# Patient Record
Sex: Male | Born: 1945 | Race: White | Hispanic: No | Marital: Married | Smoking: Current every day smoker
Health system: Southern US, Community
[De-identification: ages and names within clinical notes are randomized; demographics above are authoritative.]

## PROBLEM LIST (undated history)

## (undated) ENCOUNTER — Emergency Department (HOSPITAL_COMMUNITY): Admission: EM | Payer: Medicare Other | Source: Home / Self Care

## (undated) DIAGNOSIS — E785 Hyperlipidemia, unspecified: Secondary | ICD-10-CM

## (undated) DIAGNOSIS — J189 Pneumonia, unspecified organism: Secondary | ICD-10-CM

## (undated) DIAGNOSIS — F4310 Post-traumatic stress disorder, unspecified: Secondary | ICD-10-CM

## (undated) DIAGNOSIS — G894 Chronic pain syndrome: Secondary | ICD-10-CM

## (undated) DIAGNOSIS — F32A Depression, unspecified: Secondary | ICD-10-CM

## (undated) DIAGNOSIS — G4730 Sleep apnea, unspecified: Secondary | ICD-10-CM

## (undated) DIAGNOSIS — J449 Chronic obstructive pulmonary disease, unspecified: Secondary | ICD-10-CM

## (undated) DIAGNOSIS — M1990 Unspecified osteoarthritis, unspecified site: Secondary | ICD-10-CM

## (undated) DIAGNOSIS — F419 Anxiety disorder, unspecified: Secondary | ICD-10-CM

## (undated) DIAGNOSIS — R519 Headache, unspecified: Secondary | ICD-10-CM

## (undated) DIAGNOSIS — I639 Cerebral infarction, unspecified: Secondary | ICD-10-CM

## (undated) DIAGNOSIS — F329 Major depressive disorder, single episode, unspecified: Secondary | ICD-10-CM

## (undated) DIAGNOSIS — G8929 Other chronic pain: Secondary | ICD-10-CM

## (undated) DIAGNOSIS — I82409 Acute embolism and thrombosis of unspecified deep veins of unspecified lower extremity: Secondary | ICD-10-CM

## (undated) DIAGNOSIS — G43909 Migraine, unspecified, not intractable, without status migrainosus: Secondary | ICD-10-CM

## (undated) DIAGNOSIS — M549 Dorsalgia, unspecified: Secondary | ICD-10-CM

## (undated) DIAGNOSIS — I219 Acute myocardial infarction, unspecified: Secondary | ICD-10-CM

## (undated) DIAGNOSIS — I2510 Atherosclerotic heart disease of native coronary artery without angina pectoris: Secondary | ICD-10-CM

## (undated) DIAGNOSIS — G629 Polyneuropathy, unspecified: Secondary | ICD-10-CM

## (undated) DIAGNOSIS — F1010 Alcohol abuse, uncomplicated: Secondary | ICD-10-CM

## (undated) DIAGNOSIS — R51 Headache: Secondary | ICD-10-CM

## (undated) HISTORY — DX: Depression, unspecified: F32.A

## (undated) HISTORY — DX: Acute embolism and thrombosis of unspecified deep veins of unspecified lower extremity: I82.409

## (undated) HISTORY — PX: TONSILLECTOMY: SUR1361

## (undated) HISTORY — DX: Hyperlipidemia, unspecified: E78.5

## (undated) HISTORY — DX: Alcohol abuse, uncomplicated: F10.10

## (undated) HISTORY — PX: ANTERIOR CERVICAL DECOMP/DISCECTOMY FUSION: SHX1161

## (undated) HISTORY — PX: CORONARY ANGIOPLASTY: SHX604

## (undated) HISTORY — DX: Polyneuropathy, unspecified: G62.9

## (undated) HISTORY — DX: Atherosclerotic heart disease of native coronary artery without angina pectoris: I25.10

## (undated) HISTORY — DX: Chronic pain syndrome: G89.4

## (undated) HISTORY — PX: APPENDECTOMY: SHX54

## (undated) HISTORY — PX: CARDIAC CATHETERIZATION: SHX172

## (undated) HISTORY — DX: Post-traumatic stress disorder, unspecified: F43.10

## (undated) HISTORY — PX: VASECTOMY: SHX75

## (undated) HISTORY — DX: Major depressive disorder, single episode, unspecified: F32.9

---

## 1997-04-13 DIAGNOSIS — I219 Acute myocardial infarction, unspecified: Secondary | ICD-10-CM

## 1997-04-13 HISTORY — DX: Acute myocardial infarction, unspecified: I21.9

## 1997-12-24 ENCOUNTER — Encounter: Admission: RE | Admit: 1997-12-24 | Discharge: 1998-03-24 | Payer: Self-pay | Admitting: *Deleted

## 1998-06-28 ENCOUNTER — Encounter: Payer: Self-pay | Admitting: Family Medicine

## 1998-06-28 ENCOUNTER — Ambulatory Visit (HOSPITAL_COMMUNITY): Admission: RE | Admit: 1998-06-28 | Discharge: 1998-06-28 | Payer: Self-pay | Admitting: *Deleted

## 1998-08-23 ENCOUNTER — Ambulatory Visit (HOSPITAL_COMMUNITY): Admission: RE | Admit: 1998-08-23 | Discharge: 1998-08-23 | Payer: Self-pay | Admitting: Neurosurgery

## 1998-08-23 ENCOUNTER — Encounter: Payer: Self-pay | Admitting: Neurosurgery

## 1998-08-25 ENCOUNTER — Inpatient Hospital Stay (HOSPITAL_COMMUNITY): Admission: EM | Admit: 1998-08-25 | Discharge: 1998-08-27 | Payer: Self-pay | Admitting: Neurosurgery

## 1998-10-25 ENCOUNTER — Encounter: Payer: Self-pay | Admitting: Neurosurgery

## 1998-10-29 ENCOUNTER — Ambulatory Visit (HOSPITAL_COMMUNITY): Admission: RE | Admit: 1998-10-29 | Discharge: 1998-10-29 | Payer: Self-pay | Admitting: Neurosurgery

## 1998-11-05 ENCOUNTER — Encounter: Payer: Self-pay | Admitting: Neurosurgery

## 1998-11-07 ENCOUNTER — Encounter: Payer: Self-pay | Admitting: Neurosurgery

## 1998-11-07 ENCOUNTER — Inpatient Hospital Stay (HOSPITAL_COMMUNITY): Admission: RE | Admit: 1998-11-07 | Discharge: 1998-11-09 | Payer: Self-pay | Admitting: Neurosurgery

## 1998-11-08 ENCOUNTER — Encounter: Payer: Self-pay | Admitting: Neurosurgery

## 1999-03-19 ENCOUNTER — Encounter: Payer: Self-pay | Admitting: Neurosurgery

## 1999-03-19 ENCOUNTER — Encounter: Admission: RE | Admit: 1999-03-19 | Discharge: 1999-03-19 | Payer: Self-pay | Admitting: Neurosurgery

## 1999-10-11 ENCOUNTER — Ambulatory Visit (HOSPITAL_COMMUNITY): Admission: RE | Admit: 1999-10-11 | Discharge: 1999-10-11 | Payer: Self-pay | Admitting: Internal Medicine

## 2000-03-09 ENCOUNTER — Emergency Department (HOSPITAL_COMMUNITY): Admission: EM | Admit: 2000-03-09 | Discharge: 2000-03-09 | Payer: Self-pay | Admitting: Emergency Medicine

## 2000-10-12 ENCOUNTER — Encounter: Payer: Self-pay | Admitting: Specialist

## 2000-10-12 ENCOUNTER — Ambulatory Visit (HOSPITAL_COMMUNITY): Admission: RE | Admit: 2000-10-12 | Discharge: 2000-10-12 | Payer: Self-pay | Admitting: Specialist

## 2002-11-22 ENCOUNTER — Encounter: Payer: Self-pay | Admitting: Neurosurgery

## 2002-11-22 ENCOUNTER — Ambulatory Visit (HOSPITAL_COMMUNITY): Admission: RE | Admit: 2002-11-22 | Discharge: 2002-11-22 | Payer: Self-pay | Admitting: Neurosurgery

## 2002-12-13 HISTORY — PX: POSTERIOR LUMBAR FUSION: SHX6036

## 2002-12-27 ENCOUNTER — Encounter: Payer: Self-pay | Admitting: Neurosurgery

## 2002-12-29 ENCOUNTER — Inpatient Hospital Stay (HOSPITAL_COMMUNITY): Admission: RE | Admit: 2002-12-29 | Discharge: 2003-01-02 | Payer: Self-pay | Admitting: Neurosurgery

## 2002-12-29 ENCOUNTER — Encounter: Payer: Self-pay | Admitting: Neurosurgery

## 2003-04-23 ENCOUNTER — Encounter: Admission: RE | Admit: 2003-04-23 | Discharge: 2003-07-22 | Payer: Self-pay | Admitting: Neurosurgery

## 2003-07-11 ENCOUNTER — Ambulatory Visit (HOSPITAL_COMMUNITY): Admission: RE | Admit: 2003-07-11 | Discharge: 2003-07-11 | Payer: Self-pay | Admitting: Neurosurgery

## 2003-07-13 HISTORY — PX: POSTERIOR LUMBAR FUSION: SHX6036

## 2003-08-03 ENCOUNTER — Inpatient Hospital Stay (HOSPITAL_COMMUNITY): Admission: RE | Admit: 2003-08-03 | Discharge: 2003-08-07 | Payer: Self-pay | Admitting: Neurosurgery

## 2004-03-13 ENCOUNTER — Ambulatory Visit (HOSPITAL_COMMUNITY): Admission: RE | Admit: 2004-03-13 | Discharge: 2004-03-13 | Payer: Self-pay | Admitting: Neurosurgery

## 2004-09-12 ENCOUNTER — Ambulatory Visit: Payer: Self-pay | Admitting: *Deleted

## 2004-09-19 ENCOUNTER — Ambulatory Visit: Payer: Self-pay | Admitting: *Deleted

## 2004-09-26 ENCOUNTER — Ambulatory Visit: Payer: Self-pay | Admitting: *Deleted

## 2004-10-10 ENCOUNTER — Ambulatory Visit: Payer: Self-pay | Admitting: *Deleted

## 2004-10-17 ENCOUNTER — Ambulatory Visit: Payer: Self-pay | Admitting: *Deleted

## 2004-11-14 ENCOUNTER — Ambulatory Visit: Payer: Self-pay | Admitting: *Deleted

## 2004-11-19 ENCOUNTER — Ambulatory Visit: Payer: Self-pay | Admitting: Internal Medicine

## 2004-11-21 ENCOUNTER — Ambulatory Visit: Payer: Self-pay | Admitting: Cardiology

## 2004-11-28 ENCOUNTER — Ambulatory Visit: Payer: Self-pay | Admitting: *Deleted

## 2004-12-05 ENCOUNTER — Ambulatory Visit: Payer: Self-pay | Admitting: *Deleted

## 2004-12-19 ENCOUNTER — Ambulatory Visit: Payer: Self-pay | Admitting: Internal Medicine

## 2004-12-26 ENCOUNTER — Ambulatory Visit: Payer: Self-pay | Admitting: *Deleted

## 2005-01-02 ENCOUNTER — Ambulatory Visit: Payer: Self-pay | Admitting: *Deleted

## 2005-01-16 ENCOUNTER — Ambulatory Visit: Payer: Self-pay | Admitting: *Deleted

## 2005-02-15 ENCOUNTER — Encounter: Admission: RE | Admit: 2005-02-15 | Discharge: 2005-02-15 | Payer: Self-pay | Admitting: Anesthesiology

## 2005-08-06 ENCOUNTER — Ambulatory Visit (HOSPITAL_COMMUNITY): Admission: RE | Admit: 2005-08-06 | Discharge: 2005-08-06 | Payer: Self-pay | Admitting: Neurosurgery

## 2006-02-19 ENCOUNTER — Emergency Department (HOSPITAL_COMMUNITY): Admission: EM | Admit: 2006-02-19 | Discharge: 2006-02-19 | Payer: Self-pay | Admitting: Emergency Medicine

## 2006-05-28 ENCOUNTER — Encounter: Admission: RE | Admit: 2006-05-28 | Discharge: 2006-05-28 | Payer: Self-pay | Admitting: Neurosurgery

## 2006-12-24 ENCOUNTER — Ambulatory Visit (HOSPITAL_COMMUNITY): Admission: RE | Admit: 2006-12-24 | Discharge: 2006-12-24 | Payer: Self-pay | Admitting: Neurosurgery

## 2007-01-12 ENCOUNTER — Ambulatory Visit (HOSPITAL_COMMUNITY): Admission: RE | Admit: 2007-01-12 | Discharge: 2007-01-12 | Payer: Self-pay | Admitting: Neurosurgery

## 2007-06-30 ENCOUNTER — Ambulatory Visit (HOSPITAL_COMMUNITY): Admission: RE | Admit: 2007-06-30 | Discharge: 2007-06-30 | Payer: Self-pay | Admitting: Anesthesiology

## 2007-07-12 ENCOUNTER — Ambulatory Visit: Payer: Self-pay | Admitting: Cardiology

## 2007-07-25 ENCOUNTER — Ambulatory Visit: Payer: Self-pay

## 2007-07-25 ENCOUNTER — Encounter: Payer: Self-pay | Admitting: Cardiology

## 2007-09-16 ENCOUNTER — Ambulatory Visit: Payer: Self-pay | Admitting: Vascular Surgery

## 2008-10-10 ENCOUNTER — Encounter: Admission: RE | Admit: 2008-10-10 | Discharge: 2008-10-23 | Payer: Self-pay | Admitting: Family Medicine

## 2008-11-11 HISTORY — PX: CYST EXCISION: SHX5701

## 2008-11-23 ENCOUNTER — Ambulatory Visit (HOSPITAL_BASED_OUTPATIENT_CLINIC_OR_DEPARTMENT_OTHER): Admission: RE | Admit: 2008-11-23 | Discharge: 2008-11-23 | Payer: Self-pay | Admitting: Otolaryngology

## 2008-11-23 ENCOUNTER — Encounter (INDEPENDENT_AMBULATORY_CARE_PROVIDER_SITE_OTHER): Payer: Self-pay | Admitting: Otolaryngology

## 2008-12-25 ENCOUNTER — Encounter: Admission: RE | Admit: 2008-12-25 | Discharge: 2008-12-25 | Payer: Self-pay | Admitting: Neurosurgery

## 2009-01-11 HISTORY — PX: POSTERIOR LUMBAR FUSION: SHX6036

## 2009-01-22 ENCOUNTER — Inpatient Hospital Stay (HOSPITAL_COMMUNITY): Admission: RE | Admit: 2009-01-22 | Discharge: 2009-01-25 | Payer: Self-pay | Admitting: Neurosurgery

## 2010-01-06 ENCOUNTER — Encounter: Admission: RE | Admit: 2010-01-06 | Discharge: 2010-01-06 | Payer: Self-pay | Admitting: Neurosurgery

## 2010-01-11 HISTORY — PX: FIXATION KYPHOPLASTY THORACIC SPINE: SHX1643

## 2010-01-16 ENCOUNTER — Encounter: Payer: Self-pay | Admitting: Gastroenterology

## 2010-01-23 ENCOUNTER — Ambulatory Visit (HOSPITAL_COMMUNITY)
Admission: RE | Admit: 2010-01-23 | Discharge: 2010-01-23 | Payer: Self-pay | Source: Home / Self Care | Admitting: Neurosurgery

## 2010-02-04 ENCOUNTER — Ambulatory Visit: Payer: Self-pay | Admitting: Gastroenterology

## 2010-03-24 ENCOUNTER — Encounter (INDEPENDENT_AMBULATORY_CARE_PROVIDER_SITE_OTHER): Payer: Self-pay | Admitting: *Deleted

## 2010-04-17 ENCOUNTER — Ambulatory Visit
Admission: RE | Admit: 2010-04-17 | Discharge: 2010-04-17 | Payer: Self-pay | Source: Home / Self Care | Attending: Gastroenterology | Admitting: Gastroenterology

## 2010-04-17 ENCOUNTER — Telehealth: Payer: Self-pay | Admitting: Gastroenterology

## 2010-04-28 ENCOUNTER — Telehealth: Payer: Self-pay | Admitting: Gastroenterology

## 2010-04-28 ENCOUNTER — Ambulatory Visit: Admit: 2010-04-28 | Payer: Self-pay | Admitting: Gastroenterology

## 2010-05-01 ENCOUNTER — Ambulatory Visit: Admit: 2010-05-01 | Payer: Self-pay | Admitting: Gastroenterology

## 2010-05-04 ENCOUNTER — Encounter: Payer: Self-pay | Admitting: Neurosurgery

## 2010-05-05 ENCOUNTER — Encounter: Payer: Self-pay | Admitting: Neurosurgery
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
## 2010-05-15 NOTE — Progress Notes (Signed)
Summary: Changed pt to office visit  ---- Converted from flag ---- ---- 04/17/2010 11:07 AM, Selinda Michaels RN wrote: Patient saw pre-visit nurse today and she spoke with Arlyce Dice and he is having problems and needs to be seen and not  a direct. He is seeing him on the 16th. She also thought patient may need propofol. Patient is scheduled in the [facility] for a regular on the 19th. If he changes him to propofol will need to cancel the regular colon scheduled for the 19th.  Thanks, Bonita Quin ------------------------------

## 2010-05-15 NOTE — Progress Notes (Signed)
Summary: Pt cancelled appointment   ---- Converted from flag ---- ---- 04/28/2010 10:59 AM, Leanor Kail [facility] wrote: Cancelled appt today at 11am. Says he is too sick to drive and feels awful. Also cx procedure on 1-19. Wil c-b to reschedule. ------------------------------OK - RK

## 2010-05-15 NOTE — Letter (Signed)
Summary: Pre Visit Letter Revised  [facility]  [address] [place], [place] [address]   Phone: [phone]  Fax: [phone]        03/24/2010 MRN: [id number] Danny [name] [address] [place], [place]  [address]             Procedure Date:  05/01/2010  Welcome to the Gastroenterology Division at [facility].    You are scheduled to see a nurse for your pre-procedure visit on 04/17/2010 at 10:30AM on the 3rd floor at [facility], [address].  We ask that you try to arrive at our office 15 minutes prior to your appointment time to allow for check-in.  Please take a minute to review the attached form.  If you answer "Yes" to one or more of the questions on the first page, we ask that you call the person listed at your earliest opportunity.  If you answer "No" to all of the questions, please complete the rest of the form and bring it to your appointment.    Your nurse visit will consist of discussing your medical and surgical history, your immediate family medical history, and your medications.   If you are unable to list all of your medications on the form, please bring the medication bottles to your appointment and we will list them.  We will need to be aware of both prescribed and over the counter drugs.  We will need to know exact dosage information as well.    Please be prepared to read and sign documents such as consent forms, a financial agreement, and acknowledgement forms.  If necessary, and with your consent, a friend or relative is welcome to sit-in on the nurse visit with you.  Please bring your insurance card so that we may make a copy of it.  If your insurance requires a referral to see a specialist, please bring your referral form from your primary care physician.  No co-pay is required for this nurse visit.     If you cannot keep your appointment, please call [phone] to cancel or reschedule prior to your appointment date.  This  allows [place] the opportunity to schedule an appointment for another patient in need of care.    Thank you for choosing [facility] for your medical needs.  We appreciate the opportunity to care for you.  Please visit [place] at our website  to learn more about our practice.  Sincerely, The Gastroenterology Division

## 2010-05-15 NOTE — Letter (Signed)
Summary: Pre Visit Letter Revised  [facility]  [address] [place], [place] [address]   Phone: [phone]  Fax: [phone]        01/16/2010 MRN: [id number]  [facility] [address] [place], [place]  [address]             Procedure Date:  11-8 11am   Welcome to the Gastroenterology Division at [facility]    You are scheduled to see a nurse for your pre-procedure visit on 02-04-10 at 10am on the 3rd floor at [facility], [address].  We ask that you try to arrive at our office 15 minutes prior to your appointment time to allow for check-in.  Please take a minute to review the attached form.  If you answer "Yes" to one or more of the questions on the first page, we ask that you call the person listed at your earliest opportunity.  If you answer "No" to all of the questions, please complete the rest of the form and bring it to your appointment.    Your nurse visit will consist of discussing your medical and surgical history, your immediate family medical history, and your medications.   If you are unable to list all of your medications on the form, please bring the medication bottles to your appointment and we will list them.  We will need to be aware of both prescribed and over the counter drugs.  We will need to know exact dosage information as well.    Please be prepared to read and sign documents such as consent forms, a financial agreement, and acknowledgement forms.  If necessary, and with your consent, a friend or relative is welcome to sit-in on the nurse visit with you.  Please bring your insurance card so that we may make a copy of it.  If your insurance requires a referral to see a specialist, please bring your referral form from your primary care physician.  No co-pay is required for this nurse visit.     If you cannot keep your appointment, please call [phone] to cancel or reschedule prior to your appointment date.  This  allows [place] the opportunity to schedule an appointment for another patient in need of care.    Thank you for choosing [facility] for your medical needs.  We appreciate the opportunity to care for you.  Please visit [place] at our website  to learn more about our practice.  Sincerely, The Gastroenterology Division

## 2010-05-15 NOTE — Miscellaneous (Signed)
Summary: [facility] previsit  Clinical Lists Changes 

## 2010-06-06 ENCOUNTER — Encounter (INDEPENDENT_AMBULATORY_CARE_PROVIDER_SITE_OTHER): Payer: Self-pay | Admitting: *Deleted

## 2010-06-10 NOTE — Letter (Signed)
Summary: New Patient letter  [facility]  [address] [place], [place] [address]   Phone: [phone]  Fax: [phone]       06/06/2010 MRN: [id number]  MD SMOLA [address] [place], [place]  [address]  Dear Mr. Hernon,  Welcome to the Gastroenterology Division at [facility].    You are scheduled to see Dr.  Arlyce Dice on 07-21-10 at 2:00P.M. on the 3rd floor at [facility], [address].  We ask that you try to arrive at our office 15 minutes prior to your appointment time to allow for check-in.  We would like you to complete the enclosed self-administered evaluation form prior to your visit and bring it with you on the day of your appointment.  We will review it with you.  Also, please bring a complete list of all your medications or, if you prefer, bring the medication bottles and we will list them.  Please bring your insurance card so that we may make a copy of it.  If your insurance requires a referral to see a specialist, please bring your referral form from your primary care physician.  Co-payments are due at the time of your visit and may be paid by cash, check or credit card.     Your office visit will consist of a consult with your physician (includes a physical exam), any laboratory testing he/she may order, scheduling of any necessary diagnostic testing (e.g. x-ray, ultrasound, CT-scan), and scheduling of a procedure (e.g. Endoscopy, Colonoscopy) if required.  Please allow enough time on your schedule to allow for any/all of these possibilities.    If you cannot keep your appointment, please call [phone] to cancel or reschedule prior to your appointment date.  This allows [place] the opportunity to schedule an appointment for another patient in need of care.  If you do not cancel or reschedule by 5 p.m. the business day prior to your appointment date, you will be charged a $50.00 late cancellation/no-show fee.    Thank you for choosing  [facility] for your medical needs.  We appreciate the opportunity to care for you.  Please visit [place] at our website  to learn more about our practice.                     Sincerely,                                                             The Gastroenterology Division

## 2010-06-23 ENCOUNTER — Ambulatory Visit: Payer: Medicare Other | Attending: Neurology

## 2010-06-23 DIAGNOSIS — G4733 Obstructive sleep apnea (adult) (pediatric): Secondary | ICD-10-CM | POA: Insufficient documentation

## 2010-06-23 DIAGNOSIS — R5381 Other malaise: Secondary | ICD-10-CM | POA: Insufficient documentation

## 2010-06-23 DIAGNOSIS — R0609 Other forms of dyspnea: Secondary | ICD-10-CM | POA: Insufficient documentation

## 2010-06-23 DIAGNOSIS — R0989 Other specified symptoms and signs involving the circulatory and respiratory systems: Secondary | ICD-10-CM | POA: Insufficient documentation

## 2010-06-25 LAB — BASIC METABOLIC PANEL
CO2: 31 mEq/L (ref 19–32)
Chloride: 105 mEq/L (ref 96–112)
GFR calc Af Amer: >60 mL/min (ref >60)
Sodium: 138 mEq/L (ref 135–145)

## 2010-06-25 LAB — CBC
Hemoglobin: 14.1 g/dL (ref 13.0–17.0)
MCH: 30.3 pg (ref 26.0–34.0)
MCV: 91.8 fL (ref 78.0–100.0)
RBC: 4.65 MIL/uL (ref 4.22–5.81)

## 2010-06-25 LAB — SURGICAL PCR SCREEN: Staphylococcus aureus: NEGATIVE

## 2010-07-17 LAB — CBC
HCT: 41.2 % (ref 39.0–52.0)
Hemoglobin: 14 g/dL (ref 13.0–17.0)
RBC: 4.6 MIL/uL (ref 4.22–5.81)
RDW: 14 % (ref 11.5–15.5)

## 2010-07-17 LAB — ABO/RH: ABO/RH(D): O POS

## 2010-07-17 LAB — BASIC METABOLIC PANEL
CO2: 29 mEq/L (ref 19–32)
GFR calc Af Amer: >60 mL/min (ref >60)
GFR calc non Af Amer: >60 mL/min (ref >60)
Glucose, Bld: 98 mg/dL (ref 70–99)
Potassium: 4.5 mEq/L (ref 3.5–5.1)
Sodium: 139 mEq/L (ref 135–145)

## 2010-07-17 LAB — TYPE AND SCREEN

## 2010-07-21 ENCOUNTER — Ambulatory Visit: Payer: Self-pay | Admitting: Gastroenterology

## 2010-08-26 NOTE — Op Note (Signed)
NAMEFERRON, Danny               ACCOUNT NO.:  [id number]   MEDICAL RECORD NO.:  [id number]          PATIENT TYPE:  AMB   LOCATION:  DSC                          FACILITY:  [facility]   PHYSICIAN:  Christopher E. Ezzard Standing, M.D.DATE OF BIRTH:  [birth year]   DATE OF PROCEDURE:  11/23/2008  DATE OF DISCHARGE:                               OPERATIVE REPORT   PREOPERATIVE DIAGNOSIS:  Right posterior neck cyst consistent with  sebaceous cyst.   POSTOPERATIVE DIAGNOSIS:  Right posterior neck cyst consistent with  sebaceous cyst.   OPERATION:  Excision of a right posterior neck cyst with intermediate  closure, 2- to 3-cm size.   SURGEON:  Kristine Garbe. Ezzard Standing, MD   ANESTHESIA:  Local Xylocaine with epinephrine.   COMPLICATIONS:  None.   BRIEF CLINICAL NOTE:  Danny [name] is a 65 year old gentleman who has  had approximately 2.5-cm nodule or cyst in the posterior neck that has  been enlarging over the last several months.  He has had this for a  couple of years but only recently has started to enlarge.  On  examination, he has a round cyst in the posterior neck just below the  hairline that is consistent with a probable sebaceous cyst.  He is taken  to the operating room for excision because of enlarging and discomfort  is causing.   DESCRIPTION OF PROCEDURE:  The area was prepped with Betadine solution  and draped out with sterile towels.  The cyst area was then injected  with 10 mL of Xylocaine with epinephrine for local anesthetic.  The skin  directly overlying the cyst was elliptically excised.  Then, the  dissection was carried down through the subcutaneous tissue where the  cyst was identified.  It was dissected off the deep muscle fascia.  Clinically, this was consistent with a sebaceous cyst.  Defect was  closed with 3-0 chromic sutures subcutaneously and 5-0 nylon to  reapproximate the skin edges.  Cyst was sent to pathology.  Bacitracin  ointment and dressing was applied to  the excision site.   Danny [name] is discharged home on Keflex 500 mg b.i.d. for 5 days, Tylenol  p.r.n. pain.  We will have him follow up in my office in 10 days for  recheck to have the sutures removed.           ______________________________  Kristine Garbe Ezzard Standing, M.D.     CEN/MEDQ  D:  11/23/2008  T:  11/24/2008  Job:  [id number]
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
## 2010-08-29 NOTE — Discharge Summary (Signed)
   [name], Danny [name]                         ACCOUNT NO.:  [id number]   MEDICAL RECORD NO.:  [id number]                   PATIENT TYPE:  INP   LOCATION:  3009                                 FACILITY:  [facility]   PHYSICIAN:  Danae Orleans. Venetia Maxon, M.D.               DATE OF BIRTH:  1945/12/09   DATE OF ADMISSION:  12/29/2002  DATE OF DISCHARGE:  01/02/2003                                 DISCHARGE SUMMARY   REASON FOR ADMISSION:  Lumbar spondylolisthesis, lumbar degenerative disk  disease, lumbar spondylosis, recurrent disk herniation.   HISTORY OF PRESENT ILLNESS AND HOSPITAL COURSE:  Danny [name] is a 65-year-  old man with L5-S1 spondylolisthesis, spondylosis, degenerative disk  disease, and stenosis, and disk herniation.  He was admitted to the hospital  and underwent L5 Gill procedure with bilateral L4 laminectomies with  transverse lumbar interbody fusion L4-5 and L5-S1 levels with pedicle screw  fixation at L4 through S1 bilaterally with posterolateral arthrodesis and  morselzied bone autograft.  The patient tolerated the procedure well, was  gradually mobilized and was doing well on the September 21, at which point,  he was discharged home with instructions to follow up in my office in three  weeks with lumbar spine radiograph.   DISCHARGE INSTRUCTIONS:  1. Take Percocet one to two every four hours as needed for pain.  2. Valium as needed for muscle spasms.  3. Gradually mobilize in his lumbar brace.                                                Danae Orleans. Venetia Maxon, M.D.    JDS/MEDQ  D:  02/16/2003  T:  02/17/2003  Job:  [id number]

## 2010-08-29 NOTE — Discharge Summary (Signed)
NAME:  Danny [name], Danny [name]                         ACCOUNT NO.:  [id number]   MEDICAL RECORD NO.:  [id number]                   PATIENT TYPE:  INP   LOCATION:  3008                                 FACILITY:  [facility]   PHYSICIAN:  Hewitt Shorts, M.D.            DATE OF BIRTH:  Dec 30, 1945   DATE OF ADMISSION:  08/03/2003  DATE OF DISCHARGE:  08/07/2003                                 DISCHARGE SUMMARY   HISTORY OF PRESENT ILLNESS:  The patient is a 65 year old man who Dr. Venetia Maxon  operated on about four months ago.  He had a fall during the postoperative  period and subsequently developed significant back pain.  Subsequent x-rays  showed fractures of screws that had been placed at the S1 level and Dr.  Venetia Maxon felt the patient needed to be returned to the operation for  exploration of his fusion and repair of his pseudoarthrosis with new  hardware.  Further details of his history and physical examination were  included in Dr. Fredrich Birks admission note.   HOSPITAL COURSE:  The patient was admitted, underwent exploration of fusion,  revision of an L4-S1 fusion with posterolateral arthrodesis with new  posterior instrumentation.  Following surgery, he has had a moderate amount  of pain.  It was initially controlled with a PCA.  We were eventually able  to convert him over to oral analgesics.  He had some mild fever initially  which improved with incentive spirometry.  We have been able to gradually  increase his ambulation and activity.  He is now ambulating in the halls  with a rolling walker.  He has two rolling walkers at home.  He is afebrile.  His wound is healing well.  He has been given instructions regarding wound  care and activities following discharge.  He is to return to see Dr. Venetia Maxon  in two to three weeks for follow-up.  He is to let us know if he has any  other difficulties in the meantime.   DISCHARGE DIAGNOSIS:  Pseudoarthrosis with fractured sacral screws.                                  Hewitt Shorts, M.D.    RWN/MEDQ  D:  08/07/2003  T:  08/07/2003  Job:  [id number]
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
## 2010-08-29 NOTE — Op Note (Signed)
[name], [name]                         ACCOUNT NO.:  [id number]   MEDICAL RECORD NO.:  [id number]                   PATIENT TYPE:  INP   LOCATION:  3009                                 FACILITY:  [facility]   PHYSICIAN:  Danny [name], M.D.               DATE OF BIRTH:  08/02/1945   DATE OF PROCEDURE:  12/29/2002  DATE OF DISCHARGE:                                 OPERATIVE REPORT   PREOPERATIVE DIAGNOSIS:  L5-S1 spondylolisthesis with spondylosis, stenosis,  degenerative disk disease, and radiculopathy at L4-5 and L5-S1 levels.   POSTOPERATIVE DIAGNOSIS:  L5-S1 spondylolisthesis with spondylosis,  stenosis, degenerative disk disease, and radiculopathy at L4-5 and L5-S1  levels.   PROCEDURES:  L5 Gill procedure with bilateral L4 laminectomies, with  transverse lumbar interbody fusion, L4-5 level (13 mm Synthes allograft with  morselized bone autograft) and L5-S1 (11 mm Synthes allograft with  morcellized bone autograft) with pedicle screw fixation L4 through S1  bilaterally, with posterolateral arthrodesis utilizing morcellized autograft  with platelet concentrate.   SURGEON:  Danny [name], M.D.   ASSISTANT:  Payton Doughty, M.D.   ANESTHESIA:  General endotracheal anesthesia.   ESTIMATED BLOOD LOSS:  600 mL, with 300 mL of Cell Saver blood returned to  the patient.   COMPLICATIONS:  None.   DISPOSITION:  Recovery.   INDICATIONS:  Danny [name] is a 65 year old man with grade 1  spondylolisthesis of L5 on S1 with retrolisthesis of L4 and L5 with stenosis  of the L4-5 level, with degenerative disk disease, spondylosis, and  radiculopathy.  It was elected to take him to surgery for decompression and  fusion at these affected levels.   DESCRIPTION OF PROCEDURE:  Danny [name] was brought to the operating room.  Following the satisfactory and uncomplicated induction of general  endotracheal anesthesia and placement of intravenous lines, the patient was  placed in the  prone position on the operating table.  His low back was then  shaved, prepped, and draped in the usual sterile fashion.  The area of  planned incision was infiltrated with 0.25% Marcaine and 0.5% lidocaine and  1:200,000 epinephrine.  An incision was made in the midline and carried  through to the lumbar dorsal fascia, which was incised bilaterally, and  utilizing subperiosteal dissection, the transverse processes of L4, L5, and  the sacral alae were identified.  The self-retaining retractor was placed to  facilitate exposure.  Intraoperative x-ray was obtained confirming correct  level with marker probes at the L4 and L5 pedicle overlying the transverse  processes.  Subsequently, a total laminectomy/Gill procedure of L5 was  performed and subsequently L4 laminectomies were performed bilaterally to  decompress the L4 nerve root, particularly on the left, and the L5 nerve  root was decompressed widely.  This was done under loupe magnification.  Subsequently, the D'Errico nerve root retractor was used to retract the  common dural tube  and the L4-5 interspace was incised and disk material was  evacuated with a variety of pituitary rongeurs and a variety of T-LIF  instruments.  The end plates were stripped of residual disk material and  subsequently after a trial sizer confirmed under lateral fluoroscopy, a 13  mm bone allograft wedge was inserted and counter-sunk appropriately.  Morcellized bone autograft was impacted overlying this allograft wedge.  Attention was then turned to the L5-S1 level where a similar decompression  was performed and the interspace was spread using a disk space spreader as  was the L4-5 level before that, and an 11 mm bone graft was inserted into  this level.  Initially, the first bone graft was inserted, but it broke on  trying to rotate it into its final position.  The broken bone graft was  removed and a new bone graft was inserted and counter-sunk appropriately,   and morcellized bone autograft was placed overlying it.  The L4, L5  transverse processes and sacral alae were decorticated and subsequently  pedicle screws were placed, 5.75 x 45-mm screws were placed at the sacrum  and the L5 levels with 50 mm x 5.75-mm screws placed at the L4 level.  Positioning of the screws was confirmed under lateral fluoroscopy.  All  screws had excellent purchase, and no cut-outs were appreciated.  The  morcellized bone autograft reconstituted with Symphony platelet concentrate  was then placed overlying the transverse processes of L4 through the sacral  alae and a 60-mm rod was placed on the left and a 50-mm rod was placed on  the right, lordosed appropriately, and locking caps were placed and these  were locked down in situ.  The nerve roots were inspected, as was the common  dural tube, and they were all found to be in good repair with no evidence of  any CSF leak.  The self-retaining retractor was removed.  Fibrin glue, which  was created from the patient's own blood, was then utilized to facilitate  hemostasis.  The deep muscle was then reapproximated with 1 Vicryl sutures,  the fascia was reapproximated with 1 Vicryl sutures, deep subcutaneous  tissues reapproximated with 2-0 Vicryl interrupted, inverted sutures, and  the skin edges were reapproximated with a 3-0 Vicryl subcuticular stitch.  The wound was dressed with Benzoin and Steri-Strips, Telfa gauze, and tape.  The patient was extubated in the operating room and taken to the recovery  room in stable and satisfactory condition, having tolerated his operation  well.  All counts were correct at the end of the case.                                               Danny [name], M.D.    JDS/MEDQ  D:  12/29/2002  T:  12/30/2002  Job:  [id number]

## 2010-09-09 ENCOUNTER — Encounter: Payer: Self-pay | Admitting: Gastroenterology

## 2010-09-29 ENCOUNTER — Emergency Department (HOSPITAL_COMMUNITY)
Admission: EM | Admit: 2010-09-29 | Discharge: 2010-09-29 | Disposition: A | Discharge status: Home / Self Care | Payer: Medicare Other | Attending: Emergency Medicine | Admitting: Emergency Medicine

## 2010-09-29 DIAGNOSIS — G8929 Other chronic pain: Secondary | ICD-10-CM | POA: Insufficient documentation

## 2010-09-29 DIAGNOSIS — M25569 Pain in unspecified knee: Secondary | ICD-10-CM | POA: Insufficient documentation

## 2010-09-29 DIAGNOSIS — M2550 Pain in unspecified joint: Secondary | ICD-10-CM | POA: Insufficient documentation

## 2010-09-29 DIAGNOSIS — M542 Cervicalgia: Secondary | ICD-10-CM | POA: Insufficient documentation

## 2010-09-29 DIAGNOSIS — Z79899 Other long term (current) drug therapy: Secondary | ICD-10-CM | POA: Insufficient documentation

## 2010-09-29 DIAGNOSIS — M549 Dorsalgia, unspecified: Secondary | ICD-10-CM | POA: Insufficient documentation

## 2010-09-29 DIAGNOSIS — Z7982 Long term (current) use of aspirin: Secondary | ICD-10-CM | POA: Insufficient documentation

## 2010-10-23 ENCOUNTER — Ambulatory Visit (INDEPENDENT_AMBULATORY_CARE_PROVIDER_SITE_OTHER): Payer: Medicare Other | Admitting: Gastroenterology

## 2010-10-23 ENCOUNTER — Encounter: Payer: Self-pay | Admitting: Gastroenterology

## 2010-10-23 DIAGNOSIS — R1031 Right lower quadrant pain: Secondary | ICD-10-CM | POA: Insufficient documentation

## 2010-10-23 DIAGNOSIS — Z1211 Encounter for screening for malignant neoplasm of colon: Secondary | ICD-10-CM

## 2010-10-23 MED ORDER — HYOSCYAMINE SULFATE ER 0.375 MG PO TBCR: EXTENDED_RELEASE_TABLET | ORAL | Status: DC

## 2010-10-23 NOTE — Assessment & Plan Note (Signed)
Plan screening colonoscopy. Patient will require propofol into his multiple narcotic use

## 2010-10-23 NOTE — Progress Notes (Signed)
History of Present Illness:  [name] is a 65 year old white male referred at the request of Dr. Tania Ade for screening colonoscopy. His major complaint is lower bowel pain that tends to occur in the mornings. It is of mild intensity. This has been chronic. He moves his bowels regularly. He denies change in bowel habits, melena or hematochezia. The patient is on multiple medications for chronic pain.    Review of Systems: He complains of chronic back pain and pain in all his extremities and joints. He suffers from depression. Pertinent positive and negative review of systems were noted in the above HPI section. All other review of systems were otherwise negative.    Current Medications, Allergies, Past Medical History, Past Surgical History, Family History and Social History were reviewed in [employer] [facility] electronic medical record  Vital signs were reviewed in today's medical record. Physical Exam: General: Well developed , well nourished, depressed-appearing male  Head: Normocephalic and atraumatic Eyes:  sclerae anicteric, EOMI Ears: Normal auditory acuity Mouth: No deformity or lesions Lungs: Clear throughout to auscultation Heart: Regular rate and rhythm; no murmurs, rubs or bruits Abdomen: Soft, non tender and non distended. No masses, hepatosplenomegaly or hernias noted. Normal Bowel sounds Rectal:deferred Musculoskeletal: Symmetrical with no gross deformities  Pulses:  Normal pulses noted Extremities: No clubbing, cyanosis, edema or deformities noted Neurological: Alert oriented x 4, grossly nonfocal Psychological:  Alert and cooperative. Normal mood and affect

## 2010-10-23 NOTE — Patient Instructions (Signed)
Colonoscopy A colonoscopy is an exam to evaluate your entire colon. In this exam, your colon is cleansed. A long fiberoptic tube is inserted through your rectum and into your colon. The fiberoptic scope (endoscope) is a long bundle of enclosed and very flexible fibers. These fibers transmit light to the area examined and send images from that area to your caregiver. Discomfort is usually minimal. You may be given a drug to help you sleep (sedative) during or prior to the procedure. This exam helps to detect lumps (tumors), polyps, inflammation, and areas of bleeding. Your caregiver may also take a small piece of tissue (biopsy) that will be examined under a microscope. BEFORE THE PROCEDURE  A clear liquid diet may be required for 2 days before the exam.   Liquid injections (enemas) or laxatives may be required.   A large amount of electrolyte solution may be given to you to drink over a short period of time. This solution is used to clean out your colon.   You should be present 1 prior to your procedure or as directed by your caregiver.   Check in at the admissions desk to fill out necessary forms if not preregistered. There will be consent forms to sign prior to the procedure. If accompanied by friends or family, there is a waiting area for them while you are having your procedure.  LET YOUR CAREGIVER KNOW ABOUT:  Allergies to food or medicine.  Medicines taken, including vitamins, herbs, eyedrops, over-the-counter medicines, and creams.   Use of steroids (by mouth or creams).   Previous problems with anesthetics or numbing medicines.   History of bleeding problems or blood clots.  Previous surgery.   Other health problems, including diabetes and kidney problems.   Possibility of pregnancy, if this applies.   AFTER THE PROCEDURE  If you received a sedative and/or pain medicine, you will need to arrange for someone to drive you home.   Occasionally, there is a little blood passed  with the first bowel movement. DO NOT be concerned.  HOME CARE INSTRUCTIONS  It is not unusual to pass moderate amounts of gas and experience mild abdominal cramping following the procedure. This is due to air being used to inflate your colon during the exam. Walking or a warm pack on your belly (abdomen) may help.   You may resume all normal meals and activities after sedatives and medicines have worn off.   Only take over-the-counter or prescription medicines for pain, discomfort, or fever as directed by your caregiver. DO NOT use aspirin or blood thinners if a biopsy was taken. Consult your caregiver for medicine usage if biopsies were taken.  FINDING OUT THE RESULTS OF YOUR TEST Not all test results are available during your visit. If your test results are not back during the visit, make an appointment with your caregiver to find out the results. Do not assume everything is normal if you have not heard from your caregiver or the medical facility. It is important for you to follow up on all of your test results. SEEK IMMEDIATE MEDICAL CARE IF:  You pass large blood clots or fill a toilet with blood following the procedure. This may also occur 10 to 14 days following the procedure. This is more likely if a biopsy was taken.   You develop abdominal pain that keeps getting worse and cannot be relieved with medicine.  Document Released: 03/27/2000 Document Re-Released: 06/24/2009 [employer] Patient Information 2011 [employer]. 

## 2010-10-23 NOTE — Assessment & Plan Note (Addendum)
Pain is nonspecific  Recommendations #1 trial of hyomax

## 2010-10-24 ENCOUNTER — Encounter: Payer: Self-pay | Admitting: Gastroenterology

## 2010-10-24 ENCOUNTER — Ambulatory Visit (AMBULATORY_SURGERY_CENTER): Payer: Medicare Other | Admitting: Gastroenterology

## 2010-10-24 DIAGNOSIS — D126 Benign neoplasm of colon, unspecified: Secondary | ICD-10-CM

## 2010-10-24 DIAGNOSIS — K635 Polyp of colon: Secondary | ICD-10-CM

## 2010-10-24 DIAGNOSIS — R1031 Right lower quadrant pain: Secondary | ICD-10-CM

## 2010-10-24 DIAGNOSIS — Z1211 Encounter for screening for malignant neoplasm of colon: Secondary | ICD-10-CM

## 2010-10-24 MED ORDER — SODIUM CHLORIDE 0.9 % IV SOLN: 500.0000 mL | INTRAVENOUS | Status: DC
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
## 2010-10-24 NOTE — Patient Instructions (Addendum)
Polyps, Colon  A polyp is extra tissue that grows inside your body. Colon polyps grow in the large intestine. The large intestine, also called the colon, is part of your digestive system. It is a long, hollow tube at the end of your digestive tract where your body makes and stores stool. Most polyps are not dangerous. They are benign. This means they are not cancerous. But over time, some types of polyps can turn into cancer. Polyps that are smaller than a pea are usually not harmful. But larger polyps could someday become or may already be cancerous. To be safe, doctors remove all polyps and test them.  WHO GETS POLYPS? Anyone can get polyps, but certain people are more likely than others. You may have a greater chance of getting polyps if:  You are over 50.   You have had polyps before.   Someone in your family has had polyps.   Someone in your family has had cancer of the large intestine.   Find out if someone in your family has had polyps. You may also be more likely to get polyps if you:   Eat a lot of fatty foods   Smoke   Drink alcohol   Do not exercise  Eat too much  SYMPTOMS Most small polyps do not cause symptoms. People often do not know they have one until their caregiver finds it during a regular checkup or while testing them for something else. Some people do have symptoms like these:  Bleeding from the anus. You might notice blood on your underwear or on toilet paper after you have had a bowel movement.   Constipation or diarrhea that lasts more than a week.   Blood in the stool. Blood can make stool look black or it can show up as red streaks in the stool.  If you have any of these symptoms, see your caregiver. HOW DOES THE DOCTOR TEST FOR POLYPS? The doctor can use four tests to check for polyps:  Digital rectal exam. The caregiver wears gloves and checks your rectum (the last part of the large intestine) to see if it feels normal. This test would find polyps only  in the rectum. Your caregiver may need to do one of the other tests listed below to find polyps higher up in the intestine.   Barium enema. The caregiver puts a liquid called barium into your rectum before taking x-rays of your large intestine. Barium makes your intestine look white in the pictures. Polyps are dark, so they are easy to see.   Sigmoidoscopy. With this test, the caregiver can see inside your large intestine. A thin flexible tube is placed into your rectum. The device is called a sigmoidoscope, which has a light and a tiny video camera in it. The caregiver uses the sigmoidoscope to look at the last third of your large intestine.   Colonoscopy. This test is like sigmoidoscopy, but the caregiver looks at all of the large intestine. It usually requires sedation. This is the most common method for finding and removing polyps.  TREATMENT  The caregiver will remove the polyp during sigmoidoscopy or colonoscopy. The polyp is then tested for cancer.   If you have had polyps, your caregiver may want you to get tested regularly in the future.  PREVENTION There is not one sure way to prevent polyps. You might be able to lower your risk of getting them if you:  Eat more fruits and vegetables and less fatty food.     Do not smoke.   Avoid alcohol.   Exercise every day.   Lose weight if you are overweight.   Eating more calcium and folate can also lower your risk of getting polyps. Some foods that are rich in calcium are milk, cheese, and broccoli. Some foods that are rich in folate are chickpeas, kidney beans, and spinach.   Aspirin might help prevent polyps. Studies are under way.  Document Released: 12/25/2003 Document Re-Released: 09/17/2009 [facility] Patient Information 2011 [place], [place].   Await pathology results for final recommendations.  See blue and green sheets for additional d/c instructions.

## 2010-10-27 ENCOUNTER — Telehealth: Payer: Self-pay | Admitting: *Deleted

## 2010-10-27 NOTE — Telephone Encounter (Signed)

## 2010-10-29 ENCOUNTER — Emergency Department (HOSPITAL_COMMUNITY)
Admission: EM | Admit: 2010-10-29 | Discharge: 2010-10-29 | Disposition: A | Discharge status: Home / Self Care | Payer: Medicare Other | Attending: Emergency Medicine | Admitting: Emergency Medicine

## 2010-10-29 ENCOUNTER — Emergency Department (HOSPITAL_COMMUNITY): Payer: Medicare Other

## 2010-10-29 DIAGNOSIS — M545 Low back pain, unspecified: Secondary | ICD-10-CM | POA: Insufficient documentation

## 2010-10-29 DIAGNOSIS — Z79899 Other long term (current) drug therapy: Secondary | ICD-10-CM | POA: Insufficient documentation

## 2010-10-29 DIAGNOSIS — S20229A Contusion of unspecified back wall of thorax, initial encounter: Secondary | ICD-10-CM | POA: Insufficient documentation

## 2010-10-29 DIAGNOSIS — Y92009 Unspecified place in unspecified non-institutional (private) residence as the place of occurrence of the external cause: Secondary | ICD-10-CM | POA: Insufficient documentation

## 2010-10-29 DIAGNOSIS — G8929 Other chronic pain: Secondary | ICD-10-CM | POA: Insufficient documentation

## 2010-10-29 DIAGNOSIS — W19XXXA Unspecified fall, initial encounter: Secondary | ICD-10-CM | POA: Insufficient documentation

## 2010-11-05 ENCOUNTER — Telehealth: Payer: Self-pay | Admitting: *Deleted

## 2010-11-05 MED ORDER — HYOSCYAMINE SULFATE ER 0.375 MG PO TB12: 0.3750 mg | ORAL_TABLET | Freq: Two times a day (BID) | ORAL | Status: DC | PRN

## 2010-11-05 NOTE — Telephone Encounter (Signed)
Message copied by Marlowe Kays on Wed Nov 05, 2010  1:07 PM ------      Message from: Melvia Heaps MD D      Created: Mon Nov 03, 2010  8:50 AM       Patient needs an office visit to review the pathology

## 2010-11-05 NOTE — Telephone Encounter (Signed)
Message copied by Marlowe Kays on Wed Nov 05, 2010  1:03 PM ------      Message from: Melvia Heaps MD D      Created: Mon Nov 03, 2010  8:50 AM       Patient needs an office visit to review the pathology

## 2010-11-05 NOTE — Telephone Encounter (Signed)
Try hyomax 0.375mg  bid prn #25

## 2010-11-05 NOTE — Telephone Encounter (Signed)
PT HAVING SEVERE ABDOMINAL CRAMPS EVER SINCE PROCEDURE, EXPLAINED BY THE PATIENT NAUSEA AND CRAMPING   HAVE NOT TAKEN TEMPERATURE  NEEDS TO SPEAK TO NURSE

## 2010-11-05 NOTE — Telephone Encounter (Signed)
Pt reports that he is having bad abdominal cramping, states it has been going on since the colon last week. He is drinking liquids and eating soups. He is nauseated but is not vomiting. He reports he is having normal bowel movements. He is laying in the bed most of the time, states it hurts to bad to get up and move around. Dr. Arlyce Dice please advise.

## 2010-11-05 NOTE — Telephone Encounter (Signed)
Pt aware and rx sent to pharmacy. 

## 2010-11-05 NOTE — Telephone Encounter (Signed)
Scheduled pt for 12/23/2010 at 9:15 to follow up with Dr Arlyce Dice to discuss his pathology

## 2010-12-23 ENCOUNTER — Ambulatory Visit (INDEPENDENT_AMBULATORY_CARE_PROVIDER_SITE_OTHER): Payer: Medicare Other | Admitting: Gastroenterology

## 2010-12-23 ENCOUNTER — Encounter: Payer: Self-pay | Admitting: Gastroenterology

## 2010-12-23 VITALS — BP 98/52 | HR 76 | Ht 76.0 in | Wt 200.8 lb

## 2010-12-23 DIAGNOSIS — I2510 Atherosclerotic heart disease of native coronary artery without angina pectoris: Secondary | ICD-10-CM

## 2010-12-23 DIAGNOSIS — Z1211 Encounter for screening for malignant neoplasm of colon: Secondary | ICD-10-CM

## 2010-12-23 DIAGNOSIS — K635 Polyp of colon: Secondary | ICD-10-CM

## 2010-12-23 DIAGNOSIS — F329 Major depressive disorder, single episode, unspecified: Secondary | ICD-10-CM

## 2010-12-23 DIAGNOSIS — G894 Chronic pain syndrome: Secondary | ICD-10-CM | POA: Insufficient documentation

## 2010-12-23 DIAGNOSIS — D126 Benign neoplasm of colon, unspecified: Secondary | ICD-10-CM

## 2010-12-23 MED ORDER — PEG-KCL-NACL-NASULF-NA ASC-C 100 G PO SOLR: 1.0000 | Freq: Once | ORAL | Status: DC

## 2010-12-23 NOTE — Progress Notes (Signed)
[name] is a 65 year old white male patient of Dr. Tania Ade here following colonoscopy to discuss findings. Screening colonoscopy demonstrated multiple adenomatous polyps. A sessile polyp encompassing the ileocecal valve was seen and biopsied. This showed adenomatous changes only. He has no GI complaints.  Patient's medical history includes coronary artery disease, neuropathy, depression, chronic pain syndrome and PTSD    Review of Systems: He has chronic back pain and pain in his lower extremities. He suffers from severe depression. Pertinent positive and negative review of systems were noted in the above HPI section. All other review of systems were otherwise negative.    Current Medications, Allergies, Past Medical History, Past Surgical History, Family History and Social History were reviewed in [employer] [facility] electronic medical record  Vital signs were reviewed in today's medical record. Physical Exam: General: Well developed , well nourished, no acute distress Head: Normocephalic and atraumatic Eyes:  sclerae anicteric, EOMI Ears: Normal auditory acuity Mouth: No deformity or lesions Lungs: There are a few inspirational wheezes Heart: Regular rate and rhythm; no murmurs, rubs or bruits Abdomen: Soft, non tender and non distended. No masses, hepatosplenomegaly or hernias noted. Normal Bowel sounds Rectal:deferred Musculoskeletal: Symmetrical with no gross deformities  Pulses:  Normal pulses noted Extremities: No clubbing, cyanosis,  or deformities noted; there is 2+ pedal edema Neurological: Alert oriented x 4, grossly nonfocal Psychological:  Alert and cooperative. Normal mood and affect

## 2010-12-23 NOTE — Patient Instructions (Signed)
Colonoscopy A colonoscopy is an exam to evaluate your entire colon. In this exam, your colon is cleansed. A long fiberoptic tube is inserted through your rectum and into your colon. The fiberoptic scope (endoscope) is a long bundle of enclosed and very flexible fibers. These fibers transmit light to the area examined and send images from that area to your caregiver. Discomfort is usually minimal. You may be given a drug to help you sleep (sedative) during or prior to the procedure. This exam helps to detect lumps (tumors), polyps, inflammation, and areas of bleeding. Your caregiver may also take a small piece of tissue (biopsy) that will be examined under a microscope. BEFORE THE PROCEDURE  A clear liquid diet may be required for 2 days before the exam.   Liquid injections (enemas) or laxatives may be required.   A large amount of electrolyte solution may be given to you to drink over a short period of time. This solution is used to clean out your colon.   You should be present 2 prior to your procedure or as directed by your caregiver.   Check in at the admissions desk to fill out necessary forms if not preregistered. There will be consent forms to sign prior to the procedure. If accompanied by friends or family, there is a waiting area for them while you are having your procedure.  LET YOUR CAREGIVER KNOW ABOUT:  Allergies to food or medicine.  Medicines taken, including vitamins, herbs, eyedrops, over-the-counter medicines, and creams.   Use of steroids (by mouth or creams).   Previous problems with anesthetics or numbing medicines.   History of bleeding problems or blood clots.  Previous surgery.   Other health problems, including diabetes and kidney problems.   Possibility of pregnancy, if this applies.   AFTER THE PROCEDURE  If you received a sedative and/or pain medicine, you will need to arrange for someone to drive you home.   Occasionally, there is a little blood passed  with the first bowel movement. DO NOT be concerned.  HOME CARE INSTRUCTIONS  It is not unusual to pass moderate amounts of gas and experience mild abdominal cramping following the procedure. This is due to air being used to inflate your colon during the exam. Walking or a warm pack on your belly (abdomen) may help.   You may resume all normal meals and activities after sedatives and medicines have worn off.   Only take over-the-counter or prescription medicines for pain, discomfort, or fever as directed by your caregiver. DO NOT use aspirin or blood thinners if a biopsy was taken. Consult your caregiver for medicine usage if biopsies were taken.  FINDING OUT THE RESULTS OF YOUR TEST Not all test results are available during your visit. If your test results are not back during the visit, make an appointment with your caregiver to find out the results. Do not assume everything is normal if you have not heard from your caregiver or the medical facility. It is important for you to follow up on all of your test results. SEEK IMMEDIATE MEDICAL CARE IF:You pass large blood clots or fill a toilet with blood following the procedure. This may also occur 10 to 14 days following the procedure. This is more likely if a biopsy was taken.   You develop abdominal pain that keeps getting worse and cannot be relieved with medicine.  Document Released: 03/27/2000 Document Re-Released: 06/24/2009 [facility] Patient Information 2011 [place], [place]. Your Colonoscopy is scheduled on 01/27/2011 at 12:30pm You  can pick up your MoviPrep from your pharmacy today

## 2010-12-23 NOTE — Assessment & Plan Note (Addendum)
He has multiple colon polyps including a large sessile polyp in the area of the ileocecal valve.  . Methods of removal including colonoscopy with submucosal injection,  and surgical resection were discussed in detail including the higher risk for perforation, bleeding and incomplete removal by colonoscopy. The patient has elected to proceed with colonoscopy.

## 2011-01-02 ENCOUNTER — Telehealth: Payer: Self-pay | Admitting: Gastroenterology

## 2011-01-02 NOTE — Telephone Encounter (Signed)
Pt is scheduled for propofol colon with erbe on 01/27/11 to have a polyp removed. Pts wife is calling wanting to know how soon it would be after the procedure for her husband to travel safely. Dr. Arlyce Dice please advise.

## 2011-01-02 NOTE — Telephone Encounter (Signed)
Pt aware.

## 2011-01-02 NOTE — Telephone Encounter (Signed)
2 weeks to be safe

## 2011-01-27 ENCOUNTER — Other Ambulatory Visit: Payer: Self-pay | Admitting: Gastroenterology

## 2011-01-27 ENCOUNTER — Ambulatory Visit (HOSPITAL_COMMUNITY)
Admission: RE | Admit: 2011-01-27 | Discharge: 2011-01-27 | Disposition: A | Discharge status: Home / Self Care | Payer: Medicare Other | Source: Ambulatory Visit | Attending: Gastroenterology | Admitting: Gastroenterology

## 2011-01-27 ENCOUNTER — Other Ambulatory Visit: Payer: Medicare Other | Admitting: Gastroenterology

## 2011-01-27 DIAGNOSIS — D126 Benign neoplasm of colon, unspecified: Secondary | ICD-10-CM | POA: Insufficient documentation

## 2011-01-27 DIAGNOSIS — Z01818 Encounter for other preprocedural examination: Secondary | ICD-10-CM | POA: Insufficient documentation

## 2011-02-18 ENCOUNTER — Ambulatory Visit (INDEPENDENT_AMBULATORY_CARE_PROVIDER_SITE_OTHER): Payer: Medicare Other | Admitting: Gastroenterology

## 2011-02-18 ENCOUNTER — Encounter: Payer: Self-pay | Admitting: Gastroenterology

## 2011-02-18 VITALS — BP 114/54 | HR 80 | Ht 76.0 in | Wt 197.0 lb

## 2011-02-18 DIAGNOSIS — D126 Benign neoplasm of colon, unspecified: Secondary | ICD-10-CM

## 2011-02-18 NOTE — Progress Notes (Signed)
Mr. Haisley has returned following  colonoscopy with removal of a large cecal polyp. Pathology demonstrated a sessile serrated adenoma. He complains of occasional lower bowel cramping discomfort and nausea. Although he was prescribed hyomax he has not been taking it.

## 2011-02-18 NOTE — Assessment & Plan Note (Signed)
The plan followup colonoscopy in July or August, 2013

## 2011-02-18 NOTE — Patient Instructions (Addendum)
You have been given a separate informational sheet regarding your tobacco use, the importance of quitting and local resources to help you quit. You will need to schedule a follow up colonoscopy for 10/2011

## 2011-02-24 ENCOUNTER — Emergency Department (HOSPITAL_COMMUNITY): Payer: Medicare Other

## 2011-02-24 ENCOUNTER — Encounter (HOSPITAL_COMMUNITY): Payer: Self-pay | Admitting: *Deleted

## 2011-02-24 ENCOUNTER — Emergency Department (HOSPITAL_COMMUNITY)
Admission: EM | Admit: 2011-02-24 | Discharge: 2011-02-24 | Disposition: A | Discharge status: Home / Self Care | Payer: Medicare Other | Attending: Emergency Medicine | Admitting: Emergency Medicine

## 2011-02-24 DIAGNOSIS — W07XXXA Fall from chair, initial encounter: Secondary | ICD-10-CM | POA: Insufficient documentation

## 2011-02-24 DIAGNOSIS — I2510 Atherosclerotic heart disease of native coronary artery without angina pectoris: Secondary | ICD-10-CM | POA: Insufficient documentation

## 2011-02-24 DIAGNOSIS — R51 Headache: Secondary | ICD-10-CM | POA: Insufficient documentation

## 2011-02-24 DIAGNOSIS — S2239XA Fracture of one rib, unspecified side, initial encounter for closed fracture: Secondary | ICD-10-CM | POA: Insufficient documentation

## 2011-02-24 DIAGNOSIS — W19XXXA Unspecified fall, initial encounter: Secondary | ICD-10-CM

## 2011-02-24 DIAGNOSIS — R079 Chest pain, unspecified: Secondary | ICD-10-CM | POA: Insufficient documentation

## 2011-02-24 DIAGNOSIS — H5789 Other specified disorders of eye and adnexa: Secondary | ICD-10-CM | POA: Insufficient documentation

## 2011-02-24 DIAGNOSIS — S0990XA Unspecified injury of head, initial encounter: Secondary | ICD-10-CM

## 2011-02-24 DIAGNOSIS — M545 Low back pain, unspecified: Secondary | ICD-10-CM | POA: Insufficient documentation

## 2011-02-24 DIAGNOSIS — S2232XA Fracture of one rib, left side, initial encounter for closed fracture: Secondary | ICD-10-CM

## 2011-02-24 LAB — URINALYSIS, ROUTINE W REFLEX MICROSCOPIC
Bilirubin Urine: NEGATIVE
Glucose, UA: NEGATIVE mg/dL
Hgb urine dipstick: NEGATIVE
Specific Gravity, Urine: 1.018 (ref 1.005–1.030)

## 2011-02-24 LAB — CBC
HCT: 41.1 % (ref 39.0–52.0)
Hemoglobin: 13.8 g/dL (ref 13.0–17.0)
MCH: 31.8 pg (ref 26.0–34.0)
MCHC: 33.6 g/dL (ref 30.0–36.0)
RBC: 4.34 MIL/uL (ref 4.22–5.81)

## 2011-02-24 LAB — POCT I-STAT, CHEM 8
BUN: 10 mg/dL (ref 6–23)
Creatinine, Ser: 0.9 mg/dL (ref 0.50–1.35)
Glucose, Bld: 105 mg/dL — ABNORMAL HIGH (ref 70–99)
Potassium: 3.8 mEq/L (ref 3.5–5.1)
Sodium: 142 mEq/L (ref 135–145)
TCO2: 28 mmol/L (ref 0–100)

## 2011-02-24 NOTE — ED Notes (Signed)
Pt fell 2-3 times 2 days ago and yesterday fell again.  Pt did hit head.  Pt has wound to left temporal area.   Confusion. Dizziness and headache.  Pt MD referred him here to r/o head bleed

## 2011-02-24 NOTE — ED Provider Notes (Signed)
History     CSN: [id number] Arrival date & time: 02/24/2011  5:21 PM   First MD Initiated Contact with Patient 02/24/11 1757      Chief Complaint  Patient presents with  . Headache  . Fall    (Consider location/radiation/quality/duration/timing/severity/associated sxs/prior treatment) The history is provided by the patient and a relative.   patient states that he fell. He also fell yesterday. He states he stood up in the chair he was bracing himself with fell and he hit his face. He states he hit the same area both times. He has swelling over his left eye. His family member states that he is probably on too many pain medications. Patient does not want to take less medications. There is no loss of consciousness. He also states he has pain in his left chest wall it hurts to breathe. No abdominal pain no extremity pain.  Past Medical History  Diagnosis Date  . Hyperlipemia   . CAD (coronary artery disease)   . DVT (deep venous thrombosis)   . Neuropathy   . Depression   . Alcohol abuse     hx of  . PTSD (post-traumatic stress disorder)   . Back pain   . Chronic pain syndrome     Past Surgical History  Procedure Date  . Tonsillectomy   . Appendectomy   . Neck surgery   . Back surgery     2x    Family History  Problem Relation Age of Onset  . Heart disease Mother   . Colon cancer Neg Hx     History  Substance Use Topics  . Smoking status: Current Everyday Smoker -- 1.0 packs/day for 1 years    Types: Cigarettes  . Smokeless tobacco: Not on file  . Alcohol Use: No     former alcoholic 1981      Review of Systems  Constitutional: Negative for activity change and appetite change.  HENT: Negative for neck stiffness.   Eyes: Negative for pain.  Respiratory: Negative for chest tightness and shortness of breath.   Cardiovascular: Positive for chest pain. Negative for leg swelling.  Gastrointestinal: Negative for nausea, vomiting, abdominal pain and diarrhea.    Genitourinary: Negative for flank pain.  Musculoskeletal: Positive for back pain.  Skin: Positive for wound. Negative for rash.  Neurological: Negative for weakness, numbness and headaches.  Psychiatric/Behavioral: Negative for behavioral problems.    Allergies  Review of patient's allergies indicates no known allergies.  Home Medications   Current Outpatient Rx  Name Route Sig Dispense Refill  . CARISOPRODOL 350 MG PO TABS Oral Take 350 mg by mouth at bedtime.      Marland Kitchen CITALOPRAM HYDROBROMIDE 20 MG PO TABS Oral Take 20 mg by mouth daily.      Marland Kitchen DIAZEPAM 10 MG PO TABS Oral Take 10 mg by mouth 4 (four) times daily as needed. For anxiety.    Marland Kitchen EZETIMIBE 10 MG PO TABS Oral Take 10 mg by mouth daily.      . FENTANYL 100 MCG/HR TD PT72 Transdermal Place 1 patch onto the skin every 3 (three) days.      Marland Kitchen GABAPENTIN 300 MG PO CAPS Oral Take 300 mg by mouth 5 (five) times daily.      Marland Kitchen HYDROCODONE-ACETAMINOPHEN 7.5-500 MG PO TABS Oral Take 1 tablet by mouth every 8 (eight) hours as needed. For pain.     Marland Kitchen NIACIN-SIMVASTATIN 500-20 MG PO TB24 Oral Take 2 tablets by mouth daily.      Marland Kitchen  OMEPRAZOLE 20 MG PO CPDR Oral Take 20 mg by mouth daily.      Marland Kitchen PROMETHAZINE HCL 25 MG PO TABS Oral Take 25-50 mg by mouth 2 (two) times daily as needed. For pain.     Marland Kitchen QUETIAPINE FUMARATE 50 MG PO TABS Oral Take 50 mg by mouth at bedtime.      . TRAZODONE HCL 50 MG PO TABS Oral Take 50 mg by mouth at bedtime.        BP 142/66  Pulse 73  Temp(Src) 97.3 F (36.3 C) (Oral)  Resp 20  SpO2 100%  Physical Exam  Constitutional: He is oriented to person, place, and time. He appears well-developed and well-nourished.  HENT:  Head: Normocephalic.       . Orbital ecchymosis from left eye. Abrasion to left orbital ridge. No clear suturable laceration.  Eyes: Pupils are equal, round, and reactive to light.  Neck: Normal range of motion. Neck supple.  Cardiovascular: Normal rate.   Pulmonary/Chest: Effort normal  and breath sounds normal. He exhibits tenderness.       Point tender mid lateral left chest wall. No crepitance or deformity.  Abdominal: Soft. Bowel sounds are normal. He exhibits no distension. There is no rebound.       Fentanyl patch left abdominal wall.  Musculoskeletal: Normal range of motion. He exhibits no edema.  Neurological: He is alert and oriented to person, place, and time.  Skin: Skin is warm. He is not diaphoretic.    ED Course  Procedures (including critical care time)  Labs Reviewed  CBC - Abnormal; Notable for the following:    WBC 13.4 (*)    All other components within normal limits  POCT I-STAT, CHEM 8 - Abnormal; Notable for the following:    Glucose, Bld 105 (*)    All other components within normal limits  URINALYSIS, ROUTINE W REFLEX MICROSCOPIC - Abnormal; Notable for the following:    Appearance HAZY (*)    All other components within normal limits  I-STAT, CHEM 8   Dg Ribs Unilateral W/chest Left  02/24/2011  *RADIOLOGY REPORT*  Clinical Data: 65 year old male status post fall.  Left anterior rib pain.  LEFT RIBS AND CHEST - 3+ VIEW  Comparison: 01/22/2010.  Findings: Stable large lung volumes.  Cardiac size and mediastinal contours are within normal limits.  Visualized tracheal air column is within normal limits.  Cervical ACDF hardware.  Stable biapical scarring.  No pneumothorax or pleural effusion.  No acute pulmonary opacity.  BB marker placed at the anterior 8th and 9th left rib level.  There is evidence of a minimally-displaced left anterior fifth rib fracture.  Osteopenia.  No other acute displaced rib fracture identified.  Sequelae of T12 kyphoplasty or vertebroplasty.  Other thoracic levels appear grossly intact.  IMPRESSION: 1.  Minimally-displaced left anterior fifth rib fracture.  No other acute displaced rib fracture identified. 2. No acute cardiopulmonary abnormality.  Original Report Authenticated By: Harley Hallmark, M.D.   Ct Head Wo  Contrast  02/24/2011  *RADIOLOGY REPORT*  Clinical Data: 65 year old male with fall.  Confusion, dizziness, headache.  CT HEAD WITHOUT CONTRAST  Technique:  Contiguous axial images were obtained from the base of the skull through the vertex without contrast.  Comparison: Head CT 02/19/2006.  Findings: Visualized orbit soft tissues are within normal limits. No scalp hematoma. Visualized paranasal sinuses and mastoids are clear.  No acute osseous abnormality identified.  Calcified atherosclerosis at the skull base.  Multi focal subcortical  cerebral white matter hypodensity is chronic.  Prominent dilated perivascular space in the inferior right basal ganglia. No evidence of cortically based acute infarction identified.  No acute intracranial hemorrhage identified.  No ventriculomegaly. No midline shift, mass effect, or evidence of mass lesion.  IMPRESSION: Chronic advanced cerebral white matter changes. No acute intracranial abnormality.  Original Report Authenticated By: Harley Hallmark, M.D.     1. Fall   2. Minor head injury   3. Fracture of rib of left side    Medical screening exam. Patient states that he fell yesterday and today. He has had. He denies loss of consciousness. He states he doesn't have a headache. His family member states she's worried that he is on too many medications. He states he is also having left-sided rib pain. Where he fell and hit it yesterday. He states he's had a little bit of cough. His white count is elevated at 13. He will get a urinalysis and a chest x-ray. Head CT is pending  MDM  Patient has had frequent falls. May be due to overmedication, patient does not want to adjust his medications now. He states he'll follow with his Dr. He does have a left fifth rib fracture without pneumothorax. He'll not stay for further adjustment of his pain medications. He appears able to make the decision. He will be discharged to follow with his primary care doctor.        Juliet Rude. Rubin Payor, MD 02/24/11 [phone]

## 2011-09-23 ENCOUNTER — Telehealth: Payer: Self-pay | Admitting: Gastroenterology

## 2011-09-23 NOTE — Telephone Encounter (Signed)
Pt has had problems with constipation, weight loss, and no appetite. Requesting to be seen sooner than 1st available. Pt scheduled to see Danny Cluster NP Friday 09/25/11@10 :30am. Pt aware of appt date and time.

## 2011-09-25 ENCOUNTER — Ambulatory Visit (INDEPENDENT_AMBULATORY_CARE_PROVIDER_SITE_OTHER): Payer: Medicare Other | Admitting: Nurse Practitioner

## 2011-09-25 ENCOUNTER — Ambulatory Visit (INDEPENDENT_AMBULATORY_CARE_PROVIDER_SITE_OTHER)
Admission: RE | Admit: 2011-09-25 | Discharge: 2011-09-25 | Disposition: A | Discharge status: Home / Self Care | Payer: Medicare Other | Source: Ambulatory Visit | Attending: Nurse Practitioner | Admitting: Nurse Practitioner

## 2011-09-25 ENCOUNTER — Encounter: Payer: Self-pay | Admitting: Nurse Practitioner

## 2011-09-25 ENCOUNTER — Telehealth: Payer: Self-pay | Admitting: Nurse Practitioner

## 2011-09-25 VITALS — BP 118/70 | HR 80 | Ht 78.0 in | Wt 172.8 lb

## 2011-09-25 DIAGNOSIS — K5900 Constipation, unspecified: Secondary | ICD-10-CM

## 2011-09-25 DIAGNOSIS — F3289 Other specified depressive episodes: Secondary | ICD-10-CM

## 2011-09-25 DIAGNOSIS — R109 Unspecified abdominal pain: Secondary | ICD-10-CM

## 2011-09-25 DIAGNOSIS — R1030 Lower abdominal pain, unspecified: Secondary | ICD-10-CM

## 2011-09-25 DIAGNOSIS — F329 Major depressive disorder, single episode, unspecified: Secondary | ICD-10-CM

## 2011-09-25 DIAGNOSIS — R634 Abnormal weight loss: Secondary | ICD-10-CM

## 2011-09-25 DIAGNOSIS — F32A Depression, unspecified: Secondary | ICD-10-CM

## 2011-09-25 MED ORDER — IOHEXOL 300 MG/ML  SOLN
100.0000 mL | Freq: Once | INTRAMUSCULAR | Status: AC | PRN
  Administered 2011-09-25: 100 mL via INTRAVENOUS

## 2011-09-25 NOTE — Patient Instructions (Addendum)
We have scheduled the CT  Abdomin & Pelvis for today 09-25-2011   You have been scheduled for a CT scan of the abdomen and pelvis at [facility] ([address]---this is in the same building as Architectural technologist).   You are scheduled today 09-25-2011 at 2:30 PM. . You should arrive at 2:15 PM. to your appointment time for registration. Please follow the written instructions below on the day of your exam:  WARNING: IF YOU ARE ALLERGIC TO IODINE/X-RAY DYE, PLEASE NOTIFY RADIOLOGY IMMEDIATELY AT [phone]! YOU WILL BE GIVEN A 13 HOUR PREMEDICATION PREP.  1) Do not eat or drink anything after 12:00 Noon (2 hours prior to your test) 2) You have been given 2 bottles of oral contrast to drink. The solution may taste better if refrigerated, but do NOT add ice or any other liquid to this solution. Shake well before drinking.    Drink 1 bottle of contrast @ 12:30 PM (2 hours prior to your exam)  Drink 1 bottle of contrast @ 1:30 PM   (1 hour prior to your exam)  You may take any medications as prescribed with a small amount of water except for the following: Metformin, Glucophage, Glucovance, Avandamet, Riomet, Fortamet, Actoplus Met, Janumet, Glumetza or Metaglip. The above medications must be held the day of the exam AND 48 hours after the exam.  The purpose of you drinking the oral contrast is to aid in the visualization of your intestinal tract. The contrast solution may cause some diarrhea. Before your exam is started, you will be given a small amount of fluid to drink. Depending on your individual set of symptoms, you may also receive an intravenous injection of x-ray contrast/dye. Plan on being at [facility] for 30 minutes or long, depending on the type of exam you are having performed.  If you have any questions regarding your exam or if you need to reschedule, you may call the CT department at [phone] between the hours of 8:00 am and 5:00 pm, Monday-Friday. Continue  the Metamucil. Take the Suprep to help purge the bowels, follow directions.  After using the Suprep, take Miralax, 1 dose twice daily.  ________________________________________________________________________

## 2011-09-25 NOTE — Progress Notes (Signed)
09/25/2011 NAKSH RADI [id number] May 19, 1945   HISTORY OF PRESENT ILLNESS: Patient is a 66 year old male known to Dr. Arlyce Dice for history of colon polyps. Patient was last seen November 2012 for colonoscopy followup at which time he had a large cecal polyp removed. He presents today for evaluation of constipation and lower abdominal pain present for 6 months. Of note, patient reported lower abdominal cramping and nausea at his November 2012 visit with Dr. Arlyce Dice. Patient saw PCP yesterday a couple of days ago. Xrays showed constipation, CMET and CBC were unremarkable (scanned into EPIC) He takes daily Metamucil but yesterday took MOM and used a suppository. He produced a few small pieces of stools. Patient has chronic back pain, goes to pain management and is on multiple constipating medications.    Patient has lost 20 pounds since July 2012 . Appetite is poor, no significant nausea.  He does get lower abdominal cramping with meals sometimes Wife says he lives off sweets but has done that for years.      Past Medical History  Diagnosis Date  . Hyperlipemia   . CAD (coronary artery disease)   . DVT (deep venous thrombosis)   . Neuropathy   . Depression   . Alcohol abuse     hx of  . PTSD (post-traumatic stress disorder)   . Back pain   . Chronic pain syndrome Colon polyps-serrated adenoma October 2012     Past Surgical History  Procedure Date  . Tonsillectomy   . Appendectomy   . Neck surgery   . Back surgery     2x    reports that he has been smoking Cigarettes.  He has a 1 pack-year smoking history. He does not have any smokeless tobacco history on file. He reports that he does not drink alcohol or use illicit drugs. family history includes Heart disease in his mother.  There is no history of Colon cancer. No Known Allergies    Outpatient Encounter Prescriptions as of 09/25/2011  Medication Sig Dispense Refill  . carisoprodol (SOMA) 350 MG tablet Take 350 mg by mouth at  bedtime.        . diazepam (VALIUM) 10 MG tablet Take 10 mg by mouth 4 (four) times daily as needed. For anxiety.      Marland Kitchen ezetimibe (ZETIA) 10 MG tablet Take 10 mg by mouth daily.        . fentaNYL (DURAGESIC - DOSED MCG/HR) 100 MCG/HR Place 1 patch onto the skin every 3 (three) days.        Marland Kitchen gabapentin (NEURONTIN) 300 MG capsule Take 300 mg by mouth 5 (five) times daily.        Marland Kitchen HYDROcodone-acetaminophen (LORTAB) 7.5-500 MG per tablet Take 1 tablet by mouth every 8 (eight) hours as needed. For pain.       Marland Kitchen ibuprofen (ADVIL,MOTRIN) 800 MG tablet Take 800 mg by mouth every 8 (eight) hours as needed.      . niacin-simvastatin (SIMCOR) 500-20 MG 24 hr tablet Take 2 tablets by mouth daily.        Marland Kitchen omeprazole (PRILOSEC) 20 MG capsule Take 20 mg by mouth daily.        . promethazine (PHENERGAN) 25 MG tablet Take 25-50 mg by mouth 2 (two) times daily as needed. For pain.       Marland Kitchen zolpidem (AMBIEN) 10 MG tablet Take 10 mg by mouth at bedtime as needed.      Marland Kitchen DISCONTD: citalopram (CELEXA) 20 MG tablet Take 20  mg by mouth daily.        Marland Kitchen DISCONTD: QUEtiapine (SEROQUEL) 50 MG tablet Take 50 mg by mouth at bedtime.        Marland Kitchen DISCONTD: traZODone (DESYREL) 50 MG tablet Take 50 mg by mouth at bedtime.           REVIEW OF SYSTEMS  : All other systems reviewed and negative except where noted in the History of Present Illness.   PHYSICAL EXAM: BP 118/70  Pulse 80  Ht 6\' 6"  (1.981 m)  Wt 172 lb 12.8 oz (78.382 kg)  BMI 19.97 kg/m2 General: Thin white male in no acute distress Head: Normocephalic and atraumatic Eyes:  sclerae anicteric,conjunctive pink. Ears: Normal auditory acuity Mouth: No deformity or lesions Neck: Supple, no masses.  Lungs: Clear throughout to auscultation Heart: Regular rate and rhythm; no murmurs heard Abdomen: Soft, non distended, nontender. No masses or hepatomegaly noted. Normal Bowel sounds Rectal: Not done Musculoskeletal: Symmetrical with no gross deformities  Skin: No  lesions on visible extremities Extremities: No edema or deformities noted Neurological: Alert oriented, grossly nonfocal Cervical Nodes:  No significant cervical adenopathy Psychological:  Alert and cooperative. Flat affect  ASSESSMENT AND PLAN; 1. Abdominal pain /constipation. His lower abdominal pain is likely secondary to significant constipation. Pain is worse with meals. Doubt low-grade mesenteric ischemia given location of the pain. Patient is on multiple medications which can cause constipation. Will purge bowels with a bowel prep then start twice daily MiraLax. He should continue Metamucil.  2.  History of depression, daughter passed away a year and a half ago away. He was placed on anti-depressant but didn't renew prescriiption, never saw psych again.   3. Abnormal weight loss. Documented weight loss of 20 pounds since July 2012.  Rule out malignancy. Depression may be contributing further evaluation will obtain a CT scan of the abdomen and pelvis. We will call patient with results and for a condition update regarding #1.

## 2011-09-28 NOTE — Progress Notes (Signed)
Reviewed and agree with management. Yusef Lamp D. Courtez Twaddle, M.D., FACG  

## 2011-09-28 NOTE — Telephone Encounter (Signed)
Patient calling for results of CT. Please, advise.

## 2011-09-29 NOTE — Telephone Encounter (Signed)
Patient notified of results see CT notes.

## 2011-11-09 ENCOUNTER — Ambulatory Visit: Payer: Medicare Other | Admitting: Gastroenterology

## 2011-12-16 ENCOUNTER — Ambulatory Visit: Payer: Medicare Other | Admitting: Gastroenterology

## 2012-01-27 ENCOUNTER — Encounter: Payer: Self-pay | Admitting: Gastroenterology

## 2012-07-11 ENCOUNTER — Ambulatory Visit: Payer: Self-pay | Admitting: Physician Assistant

## 2012-10-05 ENCOUNTER — Encounter: Payer: Self-pay | Admitting: Gastroenterology

## 2013-02-23 ENCOUNTER — Telehealth: Payer: Self-pay | Admitting: Gastroenterology

## 2013-02-23 NOTE — Telephone Encounter (Signed)
Pt states that he has been having abdominal pain alternating with diarrhea and constipation. Pt requesting to be seen. Pt scheduled to see Dr. Arlyce Dice tomorrow morning at 10am. Pt aware of appt.

## 2013-02-24 ENCOUNTER — Ambulatory Visit (INDEPENDENT_AMBULATORY_CARE_PROVIDER_SITE_OTHER): Payer: Medicare Other | Admitting: Gastroenterology

## 2013-02-24 ENCOUNTER — Other Ambulatory Visit (INDEPENDENT_AMBULATORY_CARE_PROVIDER_SITE_OTHER): Payer: Medicare Other

## 2013-02-24 ENCOUNTER — Encounter: Payer: Self-pay | Admitting: Gastroenterology

## 2013-02-24 ENCOUNTER — Encounter (HOSPITAL_COMMUNITY): Payer: Self-pay | Admitting: *Deleted

## 2013-02-24 ENCOUNTER — Encounter (HOSPITAL_COMMUNITY): Payer: Self-pay | Admitting: Pharmacy Technician

## 2013-02-24 VITALS — BP 132/68 | HR 80 | Ht 74.75 in | Wt 195.2 lb

## 2013-02-24 DIAGNOSIS — R109 Unspecified abdominal pain: Secondary | ICD-10-CM

## 2013-02-24 DIAGNOSIS — R0989 Other specified symptoms and signs involving the circulatory and respiratory systems: Secondary | ICD-10-CM

## 2013-02-24 DIAGNOSIS — D126 Benign neoplasm of colon, unspecified: Secondary | ICD-10-CM

## 2013-02-24 DIAGNOSIS — R1032 Left lower quadrant pain: Secondary | ICD-10-CM

## 2013-02-24 LAB — COMPREHENSIVE METABOLIC PANEL
ALT: 14 U/L (ref 0–53)
AST: 16 U/L (ref 0–37)
Albumin: 4.2 g/dL (ref 3.5–5.2)
Alkaline Phosphatase: 69 U/L (ref 39–117)
BUN: 14 mg/dL (ref 6–23)
Calcium: 9.7 mg/dL (ref 8.4–10.5)
Chloride: 104 mEq/L (ref 96–112)
GFR: 89.91 mL/min (ref >60.00)
Sodium: 139 mEq/L (ref 135–145)
Total Bilirubin: 0.5 mg/dL (ref 0.3–1.2)

## 2013-02-24 LAB — CBC WITH DIFFERENTIAL/PLATELET
Basophils Absolute: 0 10*3/uL (ref 0.0–0.1)
HCT: 44.8 % (ref 39.0–52.0)
Lymphs Abs: 1.2 10*3/uL (ref 0.7–4.0)
MCV: 89.8 fl (ref 78.0–100.0)
Monocytes Absolute: 0.4 10*3/uL (ref 0.1–1.0)
Platelets: 235 10*3/uL (ref 150.0–400.0)
RDW: 13.7 % (ref 11.5–14.6)

## 2013-02-24 NOTE — Patient Instructions (Addendum)
You have been given a separate informational sheet regarding your tobacco use, the importance of quitting and local resources to help you quit.  Go to the basement for labs today   You have been scheduled for a CT scan of the abdomen and pelvis at [facility] ([address]---this is in the same building as Architectural technologist).   You are scheduled on 03/02/2013 Thursday at 1pm 15 minutes prior to your appointment time for registration. Please follow the written instructions below on the day of your exam: WARNING: IF YOU ARE ALLERGIC TO IODINE/X-RAY DYE, PLEASE NOTIFY RADIOLOGY IMMEDIATELY AT [phone]! YOU WILL BE GIVEN A 13 HOUR PREMEDICATION PREP.  1) Do not eat or drink anything after 9am hours prior to your test) 2) You have been given 2 bottles of oral contrast to drink. The solution may taste               better if refrigerated, but do NOT add ice or any other liquid to this solution. Shake             well before drinking.    Drink 1 bottle of contrast @ 11amhours prior to your exam)  Drink 1 bottle of contrast @ 12pm our prior to your exam)  You may take any medications as prescribed with a small amount of water except for the following: Metformin, Glucophage, Glucovance, Avandamet, Riomet, Fortamet, Actoplus Met, Janumet, Glumetza or Metaglip. The above medications must be held the day of the exam AND 48 hours after the exam.  The purpose of you drinking the oral contrast is to aid in the visualization of your intestinal tract. The contrast solution may cause some diarrhea. Before your exam is started, you will be given a small amount of fluid to drink. Depending on your individual set of symptoms, you may also receive an intravenous injection of x-ray contrast/dye. Plan on being at [facility] for 30 minutes or long, depending on the type of exam you are having performed.  This test typically takes 30-45 minutes to complete.  If you have any questions  regarding your exam or if you need to reschedule, you may call the CT department at [phone] between the hours of 8:00 am and 5:00 pm, Monday-Friday.  ________________________________________________________________________

## 2013-02-24 NOTE — Progress Notes (Signed)
History of Present Illness: 67 year old white male with history of coronary artery disease, neuropathy, depression, alcohol abuse, chronic pain syndrome, and colon polyps here for evaluation of abdominal pain.  In 2012 a large polyp in the ileocecal valve was removed.  Pathology demonstrated a sessile serrated adenoma.  Although followup colonoscopy within one year was recommended the patient has not had it done.  He complains of episodes of constipation.  With constipation he's had periumbilical pain.  Pain is relieved when he moves his bowels.  He denies rectal bleeding or melena.  He suffers from chronic back pain and uses a Duragesic patch.    Past Medical History  Diagnosis Date  . Hyperlipemia   . CAD (coronary artery disease)   . DVT (deep venous thrombosis)   . Neuropathy   . Depression   . Alcohol abuse     hx of  . PTSD (post-traumatic stress disorder)   . Back pain   . Chronic pain syndrome    Past Surgical History  Procedure Laterality Date  . Tonsillectomy    . Appendectomy    . Neck surgery    . Back surgery      2x   family history includes Heart disease in his mother. There is no history of Colon cancer. Current Outpatient Prescriptions  Medication Sig Dispense Refill  . atorvastatin (LIPITOR) 20 MG tablet Take 20 mg by mouth daily.      . carisoprodol (SOMA) 350 MG tablet Take 350 mg by mouth at bedtime.        . DULoxetine (CYMBALTA) 30 MG capsule Take 30 mg by mouth 2 (two) times daily.      . fentaNYL (DURAGESIC - DOSED MCG/HR) 100 MCG/HR Place 1 patch onto the skin every 3 (three) days.        Marland Kitchen gabapentin (NEURONTIN) 800 MG tablet Take 800 mg by mouth 3 (three) times daily.      Marland Kitchen HYDROcodone-acetaminophen (LORTAB) 7.5-500 MG per tablet Take 1 tablet by mouth every 8 (eight) hours as needed. For pain.       Marland Kitchen ibuprofen (ADVIL,MOTRIN) 800 MG tablet Take 800 mg by mouth every 8 (eight) hours as needed.      Marland Kitchen omeprazole (PRILOSEC) 20 MG capsule Take 20 mg by  mouth daily as needed.       Marland Kitchen QUEtiapine (SEROQUEL) 50 MG tablet Take 50 mg by mouth at bedtime.      . Vitamin D, Ergocalciferol, (DRISDOL) 50000 UNITS CAPS capsule Take 50,000 Units by mouth 2 (two) times a week.      . promethazine (PHENERGAN) 25 MG tablet Take 25-50 mg by mouth 2 (two) times daily as needed. For pain.        No current facility-administered medications for this visit.   Allergies as of 02/24/2013  . (No Known Allergies)    reports that he has been smoking Cigarettes.  He has a 1 pack-year smoking history. He has never used smokeless tobacco. He reports that he does not drink alcohol or use illicit drugs.     Review of Systems: He has chronic back pain Pertinent positive and negative review of systems were noted in the above HPI section. All other review of systems were otherwise negative.  Vital signs were reviewed in today's medical record Physical Exam: General: Well developed , well nourished, no acute distress Skin: anicteric Head: Normocephalic and atraumatic Eyes:  sclerae anicteric, EOMI Ears: Normal auditory acuity Mouth: No deformity or lesions Neck: Supple, no  masses or thyromegaly Lungs: Clear throughout to auscultation Heart: Regular rate and rhythm; no murmurs, rubs or bruits Abdomen: Soft,  and non distended. No masses, hepatosplenomegaly or hernias noted. Normal Bowel sounds. There is slight tenderness to palpation the in the left periumbilical area.  A palpable aorta is noted and there is a very soft bruit sounds Rectal:deferred Musculoskeletal: Symmetrical with no gross deformities  Skin: No lesions on visible extremities Pulses:  Normal pulses noted Extremities: No clubbing, cyanosis, edema or deformities noted Neurological: Alert oriented x 4, grossly nonfocal Cervical Nodes:  No significant cervical adenopathy Inguinal Nodes: No significant inguinal adenopathy Psychological:  Alert and cooperative. Normal mood and affect

## 2013-02-24 NOTE — Assessment & Plan Note (Signed)
Pain may be related to constipation.  Exam is remarkable for a palpable aorta with a soft bruit although I doubt that he has a dissection.  Recommendations #1 CT of the abdomen and pelvis #2 colonoscopy

## 2013-02-24 NOTE — Assessment & Plan Note (Signed)
Patient is long overdue for followup colonoscopy.

## 2013-02-24 NOTE — Assessment & Plan Note (Signed)
Rule out aortic aneurysm  Recommendations #1 CT of the abdomen and pelvis

## 2013-02-27 ENCOUNTER — Ambulatory Visit (HOSPITAL_COMMUNITY): Payer: Medicare Other | Admitting: Anesthesiology

## 2013-02-27 ENCOUNTER — Encounter (HOSPITAL_COMMUNITY)
Admission: RE | Disposition: A | Discharge status: Home / Self Care | Payer: Self-pay | Source: Ambulatory Visit | Attending: Gastroenterology

## 2013-02-27 ENCOUNTER — Encounter (HOSPITAL_COMMUNITY): Payer: Medicare Other | Admitting: Anesthesiology

## 2013-02-27 ENCOUNTER — Ambulatory Visit (HOSPITAL_COMMUNITY)
Admission: RE | Admit: 2013-02-27 | Discharge: 2013-02-27 | Disposition: A | Discharge status: Home / Self Care | Payer: Medicare Other | Source: Ambulatory Visit | Attending: Gastroenterology | Admitting: Gastroenterology

## 2013-02-27 ENCOUNTER — Encounter (HOSPITAL_COMMUNITY): Payer: Self-pay | Admitting: *Deleted

## 2013-02-27 DIAGNOSIS — R109 Unspecified abdominal pain: Secondary | ICD-10-CM

## 2013-02-27 DIAGNOSIS — Z09 Encounter for follow-up examination after completed treatment for conditions other than malignant neoplasm: Secondary | ICD-10-CM | POA: Insufficient documentation

## 2013-02-27 DIAGNOSIS — Z860100 Personal history of colon polyps, unspecified: Secondary | ICD-10-CM | POA: Insufficient documentation

## 2013-02-27 DIAGNOSIS — F17200 Nicotine dependence, unspecified, uncomplicated: Secondary | ICD-10-CM | POA: Insufficient documentation

## 2013-02-27 DIAGNOSIS — E785 Hyperlipidemia, unspecified: Secondary | ICD-10-CM | POA: Insufficient documentation

## 2013-02-27 DIAGNOSIS — M549 Dorsalgia, unspecified: Secondary | ICD-10-CM | POA: Insufficient documentation

## 2013-02-27 DIAGNOSIS — Z79899 Other long term (current) drug therapy: Secondary | ICD-10-CM | POA: Insufficient documentation

## 2013-02-27 DIAGNOSIS — Z86718 Personal history of other venous thrombosis and embolism: Secondary | ICD-10-CM | POA: Insufficient documentation

## 2013-02-27 DIAGNOSIS — I252 Old myocardial infarction: Secondary | ICD-10-CM | POA: Insufficient documentation

## 2013-02-27 DIAGNOSIS — I2510 Atherosclerotic heart disease of native coronary artery without angina pectoris: Secondary | ICD-10-CM | POA: Insufficient documentation

## 2013-02-27 DIAGNOSIS — D126 Benign neoplasm of colon, unspecified: Secondary | ICD-10-CM

## 2013-02-27 DIAGNOSIS — G8929 Other chronic pain: Secondary | ICD-10-CM | POA: Insufficient documentation

## 2013-02-27 DIAGNOSIS — R0989 Other specified symptoms and signs involving the circulatory and respiratory systems: Secondary | ICD-10-CM

## 2013-02-27 DIAGNOSIS — K219 Gastro-esophageal reflux disease without esophagitis: Secondary | ICD-10-CM | POA: Insufficient documentation

## 2013-02-27 HISTORY — PX: COLONOSCOPY: SHX5424

## 2013-02-27 HISTORY — DX: Acute myocardial infarction, unspecified: I21.9

## 2013-02-27 SURGERY — COLONOSCOPY
Anesthesia: Monitor Anesthesia Care

## 2013-02-27 MED ORDER — LACTATED RINGERS IV SOLN
INTRAVENOUS | Status: DC
  Administered 2013-02-27: 1000 mL via INTRAVENOUS
  Administered 2013-02-27: 10:00:00 via INTRAVENOUS

## 2013-02-27 MED ORDER — PROMETHAZINE HCL 25 MG/ML IJ SOLN: 6.2500 mg | INTRAMUSCULAR | Status: DC | PRN

## 2013-02-27 MED ORDER — PHENYLEPHRINE HCL 10 MG/ML IJ SOLN
INTRAMUSCULAR | Status: DC | PRN
  Administered 2013-02-27: 80 ug via INTRAVENOUS
  Administered 2013-02-27: 120 ug via INTRAVENOUS

## 2013-02-27 MED ORDER — LIDOCAINE HCL (CARDIAC) 20 MG/ML IV SOLN
INTRAVENOUS | Status: DC | PRN
  Administered 2013-02-27: 50 mg via INTRAVENOUS

## 2013-02-27 MED ORDER — PROPOFOL INFUSION 10 MG/ML OPTIME
INTRAVENOUS | Status: DC | PRN
  Administered 2013-02-27: 200 ug/kg/min via INTRAVENOUS

## 2013-02-27 MED ORDER — MIDAZOLAM HCL 5 MG/5ML IJ SOLN
INTRAMUSCULAR | Status: DC | PRN
  Administered 2013-02-27: 2 mg via INTRAVENOUS

## 2013-02-27 MED ORDER — SODIUM CHLORIDE 0.9 % IV SOLN: INTRAVENOUS | Status: DC

## 2013-02-27 MED ORDER — FENTANYL CITRATE 0.05 MG/ML IJ SOLN
INTRAMUSCULAR | Status: DC | PRN
  Administered 2013-02-27 (×2): 50 ug via INTRAVENOUS

## 2013-02-27 NOTE — Anesthesia Preprocedure Evaluation (Addendum)
Anesthesia Evaluation  Patient identified by MRN, date of birth, ID band Patient awake    Reviewed: Allergy & Precautions, H&P , NPO status , Patient's Chart, lab work & pertinent test results  Airway Mallampati: II TM Distance: >3 FB Neck ROM: Full    Dental  (+) Edentulous Upper and Edentulous Lower   Pulmonary Current Smoker,  breath sounds clear to auscultation  Pulmonary exam normal       Cardiovascular + CAD, + Past MI (MI 1999) and DVT Rhythm:Regular Rate:Normal     Neuro/Psych Depression PTSDChronic pain syndrome negative neurological ROS  negative psych ROS   GI/Hepatic GERD-  ,(+)     substance abuse  alcohol use,   Endo/Other  negative endocrine ROS  Renal/GU negative Renal ROS  negative genitourinary   Musculoskeletal negative musculoskeletal ROS (+)   Abdominal   Peds  Hematology negative hematology ROS (+)   Anesthesia Other Findings   Reproductive/Obstetrics                          Anesthesia Physical Anesthesia Plan  ASA: III  Anesthesia Plan: MAC   Post-op Pain Management:    Induction: Intravenous  Airway Management Planned: Simple Face Mask  Additional Equipment:   Intra-op Plan:   Post-operative Plan:   Informed Consent: I have reviewed the patients History and Physical, chart, labs and discussed the procedure including the risks, benefits and alternatives for the proposed anesthesia with the patient or authorized representative who has indicated his/her understanding and acceptance.   Dental advisory given  Plan Discussed with: CRNA  Anesthesia Plan Comments:         Anesthesia Quick Evaluation

## 2013-02-27 NOTE — Anesthesia Postprocedure Evaluation (Signed)
Anesthesia Post Note  Patient: Danny [name]  Procedure(s) Performed: Procedure(s) (LRB): COLONOSCOPY (N/A)  Anesthesia type: General  Patient location: PACU  Post pain: Pain level controlled  Post assessment: Post-op Vital signs reviewed  Last Vitals:  Filed Vitals:   02/27/13 1120  BP: 138/79  Temp:   Resp: 11    Post vital signs: Reviewed  Level of consciousness: sedated  Complications: No apparent anesthesia complications

## 2013-02-27 NOTE — Interval H&P Note (Signed)
History and Physical Interval Note:  02/27/2013 10:02 AM  Danny [name]  has presented today for surgery, with the diagnosis of abdominal pain  The various methods of treatment have been discussed with the patient and family. After consideration of risks, benefits and other options for treatment, the patient has consented to  Procedure(s): COLONOSCOPY (N/A) as a surgical intervention .  The patient's history has been reviewed, patient examined, no change in status, stable for surgery.  I have reviewed the patient's chart and labs.  Questions were answered to the patient's satisfaction.    The recent H&P (dated *02/24/13**) was reviewed, the patient was examined and there is no change in the patients condition since that H&P was completed.   Danny [name]  02/27/2013, 10:02 AM    Danny [name]

## 2013-02-27 NOTE — Transfer of Care (Signed)
Immediate Anesthesia Transfer of Care Note  Patient: Danny [name]  Procedure(s) Performed: Procedure(s): COLONOSCOPY (N/A)  Patient Location: PACU  Anesthesia Type:MAC  Level of Consciousness: awake, alert , oriented and patient cooperative  Airway & Oxygen Therapy: Patient Spontanous Breathing and Patient connected to face mask oxygen  Post-op Assessment: Report given to PACU RN, Post -op Vital signs reviewed and stable and Patient moving all extremities X 4  Post vital signs: stable  Complications: No apparent anesthesia complications

## 2013-02-27 NOTE — Op Note (Signed)
[facility] [address] [place], [address]   COLONOSCOPY PROCEDURE REPORT  PATIENT: Danny [name], Danny [name]  MR#: [id number] BIRTHDATE: 11/13/1945 , 67  yrs. old GENDER: Male ENDOSCOPIST: Louis Meckel, MD REFERRED BY: PROCEDURE DATE:  02/27/2013 PROCEDURE:   Colonoscopy, diagnostic First Screening Colonoscopy - Avg.  risk and is 50 yrs.  old or older - No.  Prior Negative Screening - Now for repeat screening. N/A  History of Adenoma - Now for follow-up colonoscopy & has been > or = to 3 yrs.  No.  It has been less than 3 yrs since last colonoscopy.  Other: See Comments  Polyps Removed Today? No. Recommend repeat exam, <10 yrs? Yes.  High risk (family or personal hx). ASA CLASS:   Class II INDICATIONS:Patient's personal history of colon polyps, 2012 polyp removed ileocecal MEDICATIONS: MAC sedation, administered by CRNA  DESCRIPTION OF PROCEDURE:   After the risks benefits and alternatives of the procedure were thoroughly explained, informed consent was obtained.       The Pentax Adult Colonscope [id number] endoscope was introduced through the anus and advanced to the terminal ileum which was intubated for a short distance. No adverse events experienced.   The quality of the prep was Suprep fair  The instrument was then slowly withdrawn as the colon was fully examined.      COLON FINDINGS: A normal appearing cecum, ileocecal valve, and appendiceal orifice were identified.  The ascending, hepatic flexure, transverse, splenic flexure, descending, sigmoid colon and rectum appeared unremarkable.  No polyps or cancers were seen. Retroflexed views revealed no abnormalities. The time to cecum=  . Withdrawal time=10 minutes 0 seconds.  The scope was withdrawn and the procedure completed. COMPLICATIONS: There were no complications.  ENDOSCOPIC IMPRESSION: Normal colon  RECOMMENDATIONS: Colonoscopy 5 years   eSigned:  Louis Meckel, MD 02/27/2013 10:43  AM   cc: Belva Agee, NP and Zelphia Cairo MD   PATIENT NAME:  Danny [name], Danny [name] MR#: [id number]

## 2013-02-27 NOTE — H&P (View-Only) (Signed)
History of Present Illness: 67-year-old white male with history of coronary artery disease, neuropathy, depression, alcohol abuse, chronic pain syndrome, and colon polyps here for evaluation of abdominal pain.  In 2012 a large polyp in the ileocecal valve was removed.  Pathology demonstrated a sessile serrated adenoma.  Although followup colonoscopy within one year was recommended the patient has not had it done.  He complains of episodes of constipation.  With constipation he's had periumbilical pain.  Pain is relieved when he moves his bowels.  He denies rectal bleeding or melena.  He suffers from chronic back pain and uses a Duragesic patch.    Past Medical History  Diagnosis Date  . Hyperlipemia   . CAD (coronary artery disease)   . DVT (deep venous thrombosis)   . Neuropathy   . Depression   . Alcohol abuse     hx of  . PTSD (post-traumatic stress disorder)   . Back pain   . Chronic pain syndrome    Past Surgical History  Procedure Laterality Date  . Tonsillectomy    . Appendectomy    . Neck surgery    . Back surgery      2x   family history includes Heart disease in his mother. There is no history of Colon cancer. Current Outpatient Prescriptions  Medication Sig Dispense Refill  . atorvastatin (LIPITOR) 20 MG tablet Take 20 mg by mouth daily.      . carisoprodol (SOMA) 350 MG tablet Take 350 mg by mouth at bedtime.        . DULoxetine (CYMBALTA) 30 MG capsule Take 30 mg by mouth 2 (two) times daily.      . fentaNYL (DURAGESIC - DOSED MCG/HR) 100 MCG/HR Place 1 patch onto the skin every 3 (three) days.        . gabapentin (NEURONTIN) 800 MG tablet Take 800 mg by mouth 3 (three) times daily.      . HYDROcodone-acetaminophen (LORTAB) 7.5-500 MG per tablet Take 1 tablet by mouth every 8 (eight) hours as needed. For pain.       . ibuprofen (ADVIL,MOTRIN) 800 MG tablet Take 800 mg by mouth every 8 (eight) hours as needed.      . omeprazole (PRILOSEC) 20 MG capsule Take 20 mg by  mouth daily as needed.       . QUEtiapine (SEROQUEL) 50 MG tablet Take 50 mg by mouth at bedtime.      . Vitamin D, Ergocalciferol, (DRISDOL) 50000 UNITS CAPS capsule Take 50,000 Units by mouth 2 (two) times a week.      . promethazine (PHENERGAN) 25 MG tablet Take 25-50 mg by mouth 2 (two) times daily as needed. For pain.        No current facility-administered medications for this visit.   Allergies as of 02/24/2013  . (No Known Allergies)    reports that he has been smoking Cigarettes.  He has a 1 pack-year smoking history. He has never used smokeless tobacco. He reports that he does not drink alcohol or use illicit drugs.     Review of Systems: He has chronic back pain Pertinent positive and negative review of systems were noted in the above HPI section. All other review of systems were otherwise negative.  Vital signs were reviewed in today's medical record Physical Exam: General: Well developed , well nourished, no acute distress Skin: anicteric Head: Normocephalic and atraumatic Eyes:  sclerae anicteric, EOMI Ears: Normal auditory acuity Mouth: No deformity or lesions Neck: Supple, no   masses or thyromegaly Lungs: Clear throughout to auscultation Heart: Regular rate and rhythm; no murmurs, rubs or bruits Abdomen: Soft,  and non distended. No masses, hepatosplenomegaly or hernias noted. Normal Bowel sounds. There is slight tenderness to palpation the in the left periumbilical area.  A palpable aorta is noted and there is a very soft bruit sounds Rectal:deferred Musculoskeletal: Symmetrical with no gross deformities  Skin: No lesions on visible extremities Pulses:  Normal pulses noted Extremities: No clubbing, cyanosis, edema or deformities noted Neurological: Alert oriented x 4, grossly nonfocal Cervical Nodes:  No significant cervical adenopathy Inguinal Nodes: No significant inguinal adenopathy Psychological:  Alert and cooperative. Normal mood and affect       

## 2013-02-28 ENCOUNTER — Encounter (HOSPITAL_COMMUNITY): Payer: Self-pay | Admitting: Gastroenterology

## 2013-03-02 ENCOUNTER — Ambulatory Visit (INDEPENDENT_AMBULATORY_CARE_PROVIDER_SITE_OTHER)
Admission: RE | Admit: 2013-03-02 | Discharge: 2013-03-02 | Disposition: A | Discharge status: Home / Self Care | Payer: Medicare Other | Source: Ambulatory Visit | Attending: Gastroenterology | Admitting: Gastroenterology

## 2013-03-02 DIAGNOSIS — R109 Unspecified abdominal pain: Secondary | ICD-10-CM

## 2013-03-02 MED ORDER — IOHEXOL 300 MG/ML  SOLN
100.0000 mL | Freq: Once | INTRAMUSCULAR | Status: AC | PRN
  Administered 2013-03-02: 100 mL via INTRAVENOUS

## 2013-03-06 NOTE — Progress Notes (Signed)
Quick Note:  Reason form the patient that there are no significant findings on CT exam ______

## 2013-08-16 ENCOUNTER — Ambulatory Visit: Payer: Medicare Other | Attending: Orthopedic Surgery | Admitting: Physical Therapy

## 2013-08-16 DIAGNOSIS — R293 Abnormal posture: Secondary | ICD-10-CM | POA: Insufficient documentation

## 2013-08-16 DIAGNOSIS — R5381 Other malaise: Secondary | ICD-10-CM | POA: Insufficient documentation

## 2013-08-16 DIAGNOSIS — M545 Low back pain, unspecified: Secondary | ICD-10-CM | POA: Insufficient documentation

## 2013-08-16 DIAGNOSIS — IMO0001 Reserved for inherently not codable concepts without codable children: Secondary | ICD-10-CM | POA: Diagnosis present

## 2013-08-16 DIAGNOSIS — G459 Transient cerebral ischemic attack, unspecified: Secondary | ICD-10-CM | POA: Diagnosis not present

## 2013-08-18 ENCOUNTER — Ambulatory Visit: Payer: Medicare Other | Admitting: *Deleted

## 2013-08-18 DIAGNOSIS — IMO0001 Reserved for inherently not codable concepts without codable children: Secondary | ICD-10-CM | POA: Diagnosis not present

## 2013-08-21 ENCOUNTER — Encounter: Payer: Medicare Other | Admitting: Physical Therapy

## 2013-08-22 ENCOUNTER — Ambulatory Visit: Payer: Medicare Other | Admitting: Physical Therapy

## 2013-08-22 DIAGNOSIS — IMO0001 Reserved for inherently not codable concepts without codable children: Secondary | ICD-10-CM | POA: Diagnosis not present

## 2013-08-25 ENCOUNTER — Encounter: Payer: Medicare Other | Admitting: *Deleted

## 2013-08-29 ENCOUNTER — Encounter: Payer: Medicare Other | Admitting: Physical Therapy

## 2014-10-10 ENCOUNTER — Encounter (HOSPITAL_COMMUNITY): Payer: Self-pay | Admitting: Emergency Medicine

## 2014-10-10 ENCOUNTER — Inpatient Hospital Stay (HOSPITAL_COMMUNITY)
Admission: EM | Admit: 2014-10-10 | Discharge: 2014-10-13 | DRG: 392 | Disposition: A | Discharge status: Home / Self Care | Payer: Medicare Other | Attending: Internal Medicine | Admitting: Internal Medicine

## 2014-10-10 ENCOUNTER — Telehealth: Payer: Self-pay | Admitting: Gastroenterology

## 2014-10-10 ENCOUNTER — Emergency Department (HOSPITAL_COMMUNITY): Payer: Medicare Other

## 2014-10-10 DIAGNOSIS — G43909 Migraine, unspecified, not intractable, without status migrainosus: Secondary | ICD-10-CM | POA: Diagnosis present

## 2014-10-10 DIAGNOSIS — G894 Chronic pain syndrome: Secondary | ICD-10-CM | POA: Diagnosis present

## 2014-10-10 DIAGNOSIS — I252 Old myocardial infarction: Secondary | ICD-10-CM | POA: Diagnosis not present

## 2014-10-10 DIAGNOSIS — R109 Unspecified abdominal pain: Secondary | ICD-10-CM

## 2014-10-10 DIAGNOSIS — G629 Polyneuropathy, unspecified: Secondary | ICD-10-CM | POA: Diagnosis present

## 2014-10-10 DIAGNOSIS — Z9861 Coronary angioplasty status: Secondary | ICD-10-CM | POA: Diagnosis not present

## 2014-10-10 DIAGNOSIS — F329 Major depressive disorder, single episode, unspecified: Secondary | ICD-10-CM | POA: Diagnosis present

## 2014-10-10 DIAGNOSIS — I10 Essential (primary) hypertension: Secondary | ICD-10-CM | POA: Diagnosis present

## 2014-10-10 DIAGNOSIS — F17210 Nicotine dependence, cigarettes, uncomplicated: Secondary | ICD-10-CM | POA: Diagnosis present

## 2014-10-10 DIAGNOSIS — F4310 Post-traumatic stress disorder, unspecified: Secondary | ICD-10-CM | POA: Diagnosis present

## 2014-10-10 DIAGNOSIS — I2510 Atherosclerotic heart disease of native coronary artery without angina pectoris: Secondary | ICD-10-CM | POA: Diagnosis present

## 2014-10-10 DIAGNOSIS — K5720 Diverticulitis of large intestine with perforation and abscess without bleeding: Principal | ICD-10-CM | POA: Diagnosis present

## 2014-10-10 DIAGNOSIS — E785 Hyperlipidemia, unspecified: Secondary | ICD-10-CM | POA: Diagnosis present

## 2014-10-10 DIAGNOSIS — E876 Hypokalemia: Secondary | ICD-10-CM | POA: Diagnosis present

## 2014-10-10 DIAGNOSIS — K5792 Diverticulitis of intestine, part unspecified, without perforation or abscess without bleeding: Secondary | ICD-10-CM | POA: Diagnosis present

## 2014-10-10 DIAGNOSIS — M1990 Unspecified osteoarthritis, unspecified site: Secondary | ICD-10-CM | POA: Diagnosis present

## 2014-10-10 DIAGNOSIS — K5732 Diverticulitis of large intestine without perforation or abscess without bleeding: Secondary | ICD-10-CM

## 2014-10-10 DIAGNOSIS — G4730 Sleep apnea, unspecified: Secondary | ICD-10-CM | POA: Diagnosis present

## 2014-10-10 HISTORY — DX: Headache: R51

## 2014-10-10 HISTORY — DX: Headache, unspecified: R51.9

## 2014-10-10 HISTORY — DX: Unspecified osteoarthritis, unspecified site: M19.90

## 2014-10-10 HISTORY — DX: Migraine, unspecified, not intractable, without status migrainosus: G43.909

## 2014-10-10 HISTORY — DX: Other chronic pain: G89.29

## 2014-10-10 HISTORY — DX: Sleep apnea, unspecified: G47.30

## 2014-10-10 HISTORY — DX: Dorsalgia, unspecified: M54.9

## 2014-10-10 LAB — CBC WITH DIFFERENTIAL/PLATELET
Basophils Absolute: 0 10*3/uL (ref 0.0–0.1)
Basophils Relative: 0 % (ref 0–1)
Eosinophils Absolute: 0 10*3/uL (ref 0.0–0.7)
Eosinophils Relative: 0 % (ref 0–5)
HCT: 41.1 % (ref 39.0–52.0)
Hemoglobin: 13.9 g/dL (ref 13.0–17.0)
Lymphocytes Relative: 5 % — ABNORMAL LOW (ref 12–46)
Lymphs Abs: 0.8 10*3/uL (ref 0.7–4.0)
MCH: 30 pg (ref 26.0–34.0)
MCHC: 33.8 g/dL (ref 30.0–36.0)
MCV: 88.6 fL (ref 78.0–100.0)
Monocytes Absolute: 0.9 10*3/uL (ref 0.1–1.0)
Monocytes Relative: 6 % (ref 3–12)
Neutro Abs: 14.2 10*3/uL — ABNORMAL HIGH (ref 1.7–7.7)
Neutrophils Relative %: 89 % — ABNORMAL HIGH (ref 43–77)
Platelets: 361 10*3/uL (ref 150–400)
RBC: 4.64 MIL/uL (ref 4.22–5.81)
RDW: 13.6 % (ref 11.5–15.5)
WBC: 16 10*3/uL — ABNORMAL HIGH (ref 4.0–10.5)

## 2014-10-10 LAB — LIPASE, BLOOD: Lipase: 14 U/L — ABNORMAL LOW (ref 22–51)

## 2014-10-10 LAB — COMPREHENSIVE METABOLIC PANEL
ALT: 17 U/L (ref 17–63)
AST: 14 U/L — ABNORMAL LOW (ref 15–41)
Albumin: 3.4 g/dL — ABNORMAL LOW (ref 3.5–5.0)
Alkaline Phosphatase: 102 U/L (ref 38–126)
Anion gap: 13 (ref 5–15)
BUN: 14 mg/dL (ref 6–20)
CO2: 27 mmol/L (ref 22–32)
Calcium: 9.3 mg/dL (ref 8.9–10.3)
Chloride: 97 mmol/L — ABNORMAL LOW (ref 101–111)
Creatinine, Ser: 0.94 mg/dL (ref 0.61–1.24)
GFR calc Af Amer: >60 mL/min (ref >60)
GFR calc non Af Amer: >60 mL/min (ref >60)
Glucose, Bld: 105 mg/dL — ABNORMAL HIGH (ref 65–99)
Potassium: 3.8 mmol/L (ref 3.5–5.1)
Sodium: 137 mmol/L (ref 135–145)
Total Bilirubin: 0.8 mg/dL (ref 0.3–1.2)
Total Protein: 7.3 g/dL (ref 6.5–8.1)

## 2014-10-10 LAB — I-STAT TROPONIN, ED: Troponin i, poc: 0 ng/mL (ref 0.00–0.08)

## 2014-10-10 MED ORDER — ONDANSETRON HCL 4 MG/2ML IJ SOLN: 4.0000 mg | Freq: Four times a day (QID) | INTRAMUSCULAR | Status: DC | PRN

## 2014-10-10 MED ORDER — GABAPENTIN 800 MG PO TABS
800.0000 mg | ORAL_TABLET | Freq: Two times a day (BID) | ORAL | Status: DC
  Filled 2014-10-10: qty 1

## 2014-10-10 MED ORDER — IOHEXOL 300 MG/ML  SOLN
100.0000 mL | Freq: Once | INTRAMUSCULAR | Status: AC | PRN
  Administered 2014-10-10: 100 mL via INTRAVENOUS

## 2014-10-10 MED ORDER — MORPHINE SULFATE 4 MG/ML IJ SOLN
4.0000 mg | Freq: Once | INTRAMUSCULAR | Status: DC
  Filled 2014-10-10: qty 1

## 2014-10-10 MED ORDER — CIPROFLOXACIN IN D5W 400 MG/200ML IV SOLN
400.0000 mg | Freq: Two times a day (BID) | INTRAVENOUS | Status: DC
  Administered 2014-10-11 – 2014-10-13 (×5): 400 mg via INTRAVENOUS
  Filled 2014-10-10 (×6): qty 200

## 2014-10-10 MED ORDER — MORPHINE SULFATE 2 MG/ML IJ SOLN
1.0000 mg | INTRAMUSCULAR | Status: DC | PRN
  Administered 2014-10-10: 1 mg via INTRAVENOUS
  Filled 2014-10-10: qty 1

## 2014-10-10 MED ORDER — FENTANYL 50 MCG/HR TD PT72
75.0000 ug | MEDICATED_PATCH | TRANSDERMAL | Status: DC
  Administered 2014-10-11: 75 ug via TRANSDERMAL
  Filled 2014-10-10 (×2): qty 1

## 2014-10-10 MED ORDER — GABAPENTIN 400 MG PO CAPS
800.0000 mg | ORAL_CAPSULE | Freq: Two times a day (BID) | ORAL | Status: DC
  Administered 2014-10-10 – 2014-10-13 (×6): 800 mg via ORAL
  Filled 2014-10-10 (×6): qty 2

## 2014-10-10 MED ORDER — METRONIDAZOLE IN NACL 5-0.79 MG/ML-% IV SOLN
500.0000 mg | Freq: Three times a day (TID) | INTRAVENOUS | Status: DC
  Administered 2014-10-11 – 2014-10-13 (×8): 500 mg via INTRAVENOUS
  Filled 2014-10-10 (×9): qty 100

## 2014-10-10 MED ORDER — FENTANYL CITRATE (PF) 100 MCG/2ML IJ SOLN
50.0000 ug | Freq: Once | INTRAMUSCULAR | Status: AC
  Administered 2014-10-10: 50 ug via INTRAVENOUS
  Filled 2014-10-10: qty 2

## 2014-10-10 MED ORDER — ACETAMINOPHEN 650 MG RE SUPP
650.0000 mg | Freq: Four times a day (QID) | RECTAL | Status: DC | PRN
Start: 2014-10-10 — End: 2014-10-13

## 2014-10-10 MED ORDER — ONDANSETRON HCL 4 MG PO TABS: 4.0000 mg | ORAL_TABLET | Freq: Four times a day (QID) | ORAL | Status: DC | PRN

## 2014-10-10 MED ORDER — MORPHINE SULFATE 2 MG/ML IJ SOLN
2.0000 mg | Freq: Once | INTRAMUSCULAR | Status: AC
  Administered 2014-10-10: 2 mg via INTRAVENOUS
  Filled 2014-10-10: qty 1

## 2014-10-10 MED ORDER — METRONIDAZOLE IN NACL 5-0.79 MG/ML-% IV SOLN
500.0000 mg | Freq: Once | INTRAVENOUS | Status: AC
  Administered 2014-10-10: 500 mg via INTRAVENOUS
  Filled 2014-10-10: qty 100

## 2014-10-10 MED ORDER — ACETAMINOPHEN 325 MG PO TABS: 650.0000 mg | ORAL_TABLET | Freq: Four times a day (QID) | ORAL | Status: DC | PRN

## 2014-10-10 MED ORDER — QUETIAPINE FUMARATE 50 MG PO TABS
50.0000 mg | ORAL_TABLET | Freq: Every day | ORAL | Status: DC
  Administered 2014-10-10 – 2014-10-12 (×3): 50 mg via ORAL
  Filled 2014-10-10 (×3): qty 1

## 2014-10-10 MED ORDER — SODIUM CHLORIDE 0.9 % IV BOLUS (SEPSIS)
1000.0000 mL | Freq: Once | INTRAVENOUS | Status: AC
  Administered 2014-10-10: 1000 mL via INTRAVENOUS

## 2014-10-10 MED ORDER — MORPHINE SULFATE 2 MG/ML IJ SOLN
2.0000 mg | INTRAMUSCULAR | Status: DC | PRN
  Administered 2014-10-11 – 2014-10-12 (×6): 2 mg via INTRAVENOUS
  Filled 2014-10-10 (×6): qty 1

## 2014-10-10 MED ORDER — CIPROFLOXACIN IN D5W 400 MG/200ML IV SOLN
400.0000 mg | Freq: Once | INTRAVENOUS | Status: DC
  Filled 2014-10-10: qty 200

## 2014-10-10 MED ORDER — ONDANSETRON HCL 4 MG/2ML IJ SOLN: 4.0000 mg | Freq: Three times a day (TID) | INTRAMUSCULAR | Status: DC | PRN

## 2014-10-10 MED ORDER — IOHEXOL 300 MG/ML  SOLN
25.0000 mL | Freq: Once | INTRAMUSCULAR | Status: AC | PRN
  Administered 2014-10-10: 25 mL via ORAL

## 2014-10-10 MED ORDER — KCL IN DEXTROSE-NACL 10-5-0.45 MEQ/L-%-% IV SOLN
INTRAVENOUS | Status: DC
  Administered 2014-10-10: 21:00:00 via INTRAVENOUS
  Filled 2014-10-10 (×4): qty 1000

## 2014-10-10 MED ORDER — HYDROMORPHONE HCL 1 MG/ML IJ SOLN: 1.0000 mg | INTRAMUSCULAR | Status: DC | PRN

## 2014-10-10 MED ORDER — FENTANYL 50 MCG/HR TD PT72: 75.0000 ug | MEDICATED_PATCH | TRANSDERMAL | Status: DC

## 2014-10-10 MED ORDER — ENOXAPARIN SODIUM 40 MG/0.4ML ~~LOC~~ SOLN
40.0000 mg | SUBCUTANEOUS | Status: DC
  Administered 2014-10-12: 40 mg via SUBCUTANEOUS
  Filled 2014-10-10 (×3): qty 0.4

## 2014-10-10 NOTE — Telephone Encounter (Signed)
Spoke with Hildred Alamin. The patient had also been advised to go to the ER by Dr Melven Sartorius nurse if the pain was severe. Patient did present at [facility] ER.

## 2014-10-10 NOTE — ED Notes (Signed)
Pt states he has been unable to eat x1 week.

## 2014-10-10 NOTE — Consult Note (Signed)
Reason for Consult:Abdominal pain and acute diverticulitis with microperforation Referring Physician: [name] is an 69 y.o. male.  HPI: 6-7 days history of worsening abdominal pain.  Patient has a history of chronic back pain and was told to come into the ED by his back surgeon.  CT demonstrated acuted diverticulitis with microperforation.  Past Medical History  Diagnosis Date  . Hyperlipemia   . CAD (coronary artery disease)   . DVT (deep venous thrombosis)   . Neuropathy   . Depression   . Alcohol abuse     hx of  . PTSD (post-traumatic stress disorder)   . Back pain   . Chronic pain syndrome   . Myocardial infarction     '99- MI    Past Surgical History  Procedure Laterality Date  . Tonsillectomy    . Appendectomy    . Neck surgery      fusion retained hardware  . Cardiac catheterization      '99-last done-released  . Coronary angioplasty      '99- [facility]  . Back surgery      x4  . Colonoscopy N/A 02/27/2013    Procedure: COLONOSCOPY;  Surgeon: Inda Castle, MD;  Location: [facility];  Service: Endoscopy;  Laterality: N/A;    Family History  Problem Relation Age of Onset  . Heart disease Mother   . Colon cancer Neg Hx     Social History:  reports that he has been smoking Cigarettes.  He has a 1 pack-year smoking history. He has never used smokeless tobacco. He reports that he does not drink alcohol or use illicit drugs.  Allergies: No Known Allergies  Medications: I have reviewed the patient's current medications.  Results for orders placed or performed during the hospital encounter of 10/10/14 (from the past 48 hour(s))  CBC with Differential     Status: Abnormal   Collection Time: 10/10/14 12:45 PM  Result Value Ref Range   WBC 16.0 (H) 4.0 - 10.5 K/uL   RBC 4.64 4.22 - 5.81 MIL/uL   Hemoglobin 13.9 13.0 - 17.0 g/dL   HCT 41.1 39.0 - 52.0 %   MCV 88.6 78.0 - 100.0 fL   MCH 30.0 26.0 - 34.0 pg   MCHC 33.8 30.0 - 36.0 g/dL   RDW  13.6 11.5 - 15.5 %   Platelets 361 150 - 400 K/uL   Neutrophils Relative % 89 (H) 43 - 77 %   Neutro Abs 14.2 (H) 1.7 - 7.7 K/uL   Lymphocytes Relative 5 (L) 12 - 46 %   Lymphs Abs 0.8 0.7 - 4.0 K/uL   Monocytes Relative 6 3 - 12 %   Monocytes Absolute 0.9 0.1 - 1.0 K/uL   Eosinophils Relative 0 0 - 5 %   Eosinophils Absolute 0.0 0.0 - 0.7 K/uL   Basophils Relative 0 0 - 1 %   Basophils Absolute 0.0 0.0 - 0.1 K/uL  Comprehensive metabolic panel     Status: Abnormal   Collection Time: 10/10/14 12:45 PM  Result Value Ref Range   Sodium 137 135 - 145 mmol/L   Potassium 3.8 3.5 - 5.1 mmol/L   Chloride 97 (L) 101 - 111 mmol/L   CO2 27 22 - 32 mmol/L   Glucose, Bld 105 (H) 65 - 99 mg/dL   BUN 14 6 - 20 mg/dL   Creatinine, Ser 0.94 0.61 - 1.24 mg/dL   Calcium 9.3 8.9 - 10.3 mg/dL   Total Protein 7.3 6.5 -  8.1 g/dL   Albumin 3.4 (L) 3.5 - 5.0 g/dL   AST 14 (L) 15 - 41 U/L   ALT 17 17 - 63 U/L   Alkaline Phosphatase 102 38 - 126 U/L   Total Bilirubin 0.8 0.3 - 1.2 mg/dL   GFR calc non Af Amer >60 >60 mL/min   GFR calc Af Amer >60 >60 mL/min    Comment: (NOTE) The eGFR has been calculated using the CKD EPI equation. This calculation has not been validated in all clinical situations. eGFR's persistently <60 mL/min signify possible Chronic Kidney Disease.    Anion gap 13 5 - 15  Lipase, blood     Status: Abnormal   Collection Time: 10/10/14 12:45 PM  Result Value Ref Range   Lipase 14 (L) 22 - 51 U/L  I-stat troponin, ED (only if pt is 69 y.o. or older & pain is above umbilicus)  not at [facility], [facility]     Status: None   Collection Time: 10/10/14  1:01 PM  Result Value Ref Range   Troponin i, poc 0.00 0.00 - 0.08 ng/mL   Comment 3            Comment: Due to the release kinetics of cTnI, a negative result within the first hours of the onset of symptoms does not rule out myocardial infarction with certainty. If myocardial infarction is still suspected, repeat the test at appropriate  intervals.     Ct Abdomen Pelvis W Contrast  10/10/2014   CLINICAL DATA:  69 year old male with generalized abdominal pain.  EXAM: CT ABDOMEN AND PELVIS WITH CONTRAST  TECHNIQUE: Multidetector CT imaging of the abdomen and pelvis was performed using the standard protocol following bolus administration of intravenous contrast.  CONTRAST:  164mL OMNIPAQUE IOHEXOL 300 MG/ML  SOLN  COMPARISON:  Prior CT abdomen/ pelvis 03/02/2013  FINDINGS: Lower Chest: The lung bases are clear. Visualized cardiac structures are within normal limits for size. No pericardial effusion. Unremarkable visualized distal thoracic esophagus.  Abdomen: Scattered locules of free air are present throughout the upper abdomen in the perihepatic spaces, adjacent to the stomach, in the left subdiaphragmatic space and along the course of the omentum.  The stomach and duodenum appear intact without definitive wall thickening or evidence of perforation. There are 2 segments of sigmoid colon which are abnormally thickened including 1 in the deep anatomic pelvis position between the bladder and rectum, and a second more proximal segment in the left lower quadrant at the junction of the descending colon and sigmoid colon. Both regions of focal wall thickening are affiliated with inflammatory stranding in the adjacent pericolonic fat. No definite free air about either of these structures. The degree of inflammatory changes more prominent in the left lower quadrant at the junction of the descending and sigmoid colons. There may be trace free fluid within the left pericolic gutter. There is secondary reactive thickening of a loop of small bowel in the left lower quadrant adjacent to the inflamed segment of colon.  Unremarkable CT appearance of the spleen. The pancreas is largely fatty replaced. Low-attenuation nodularity both adrenal glands (more prominent on the right than the left) suggests adrenal hyperplasia or multiple small adenomas. The appearance  is unchanged dating back to November of 2014. Normal hepatic contour morphology. No discrete hepatic lesion. High attenuation material layers within the gallbladder lumen likely representing sludge and/or small stones. No intra or extrahepatic biliary ductal dilatation.  Stable cyst exophytic from the posterior aspect of the upper pole of  the right kidney. Slight interval progression of nephrolithiasis in the lower pole of the right kidney. The largest lower pole stone and now measures up to 5 mm in diameter. There are at least 4 stones in the right lower pole. No definite stones on the left. No evidence of hydronephrosis. No enhancing renal mass.  Pelvis: Unremarkable bladder, prostate gland and seminal vesicles. No free fluid or adenopathy.  Bones/Soft Tissues: No acute fracture or aggressive appearing lytic or blastic osseous lesion. Extensive multilevel degenerative disc disease. Surgical changes of prior L4-S1 posterior lumbar interbody fusion with additional lateral fusion and interbody graft at L3-L4. Compression fractures at T12 and L1 with changes of prior augmentation at T12. Comparing across modalities to the recent lumbar spine MRI dated 09/25/2014 there has been no significant progression of height loss of the L1 compression fracture.  Vascular: Ectatic infrarenal abdominal aorta with a maximal diameter of 2.9 cm. Scattered atherosclerotic vascular calcifications. No focal venous abnormality. Main portal and splanchnic veins are patent.  IMPRESSION: 1. CT imaging findings are most consistent with acute diverticulitis in the left lower quadrant at the junction of the descending and sigmoid colons. Additionally, there are multiple small locules of free air in the upper abdomen concerning for microperforation. No abscess collection. There is a trace volume of free fluid in the left paracolic gutter and secondary reactive inflammation of an adjacent loop of small bowel in the left lower quadrant. 2. There  is a second segment of abnormally thickened and inflamed sigmoid colon in the deep anatomic pelvis positioned between the bladder and rectum. This may represent a second site of diverticulitis, or a separate segmental infectious/inflammatory colitis. Alternately, a colonic neoplasm is difficult to exclude entirely at this location. However, the electronic medical record reveals a negative colonoscopy within the last 3 years. 3. High attenuation material consistent was sludge and/or small stones layers within the gallbladder lumen. 4. Slight progression of right lower pole nephrolithiasis. No evidence of hydronephrosis. 5. Multilevel degenerative disc disease status post posterior lumbar interbody fusion. 6. Stable L1 compression fracture without evidence of progressive height loss compared MRI 09/25/2014. 7. Ectatic infrarenal abdominal aorta with a maximal diameter of 2.9 cm. Ectatic abdominal aorta at risk for aneurysm development. Recommend followup by ultrasound in 5 years. This recommendation follows ACR consensus guidelines: White Paper of the ACR Incidental Findings Committee II on Vascular Findings. J Am Coll Radiol 2013; 10:789-794.   Electronically Signed   By: Jacqulynn Cadet M.D.   On: 10/10/2014 17:08    Review of Systems  Constitutional: Positive for fever and chills.  Eyes: Negative.   Cardiovascular: Negative.   Musculoskeletal: Positive for back pain (chronic) and neck pain (chronic).   Blood pressure 146/73, pulse 83, temperature 97.8 F (36.6 C), temperature source Oral, resp. rate 16, SpO2 99 %. Physical Exam  Constitutional: He is oriented to person, place, and time. He appears well-developed and well-nourished.  HENT:  Head: Normocephalic and atraumatic.  Eyes: Pupils are equal, round, and reactive to light.  Cardiovascular: Normal rate and normal heart sounds.   Respiratory: Effort normal and breath sounds normal.  GI: Soft. Normal appearance and bowel sounds are normal. He  exhibits no distension. There is tenderness in the right lower quadrant and left lower quadrant. There is rebound and guarding. There is no rigidity, no tenderness at McBurney's point and negative Murphy's sign.    Neurological: He is alert and oriented to person, place, and time. He has normal reflexes.  Skin: Skin  is warm and dry.  Psychiatric: Judgment and thought content normal. His affect is blunt. His speech is delayed. He is slowed and withdrawn. Cognition and memory are normal.    Assessment/Plan: Acute diverticulitis with microperforation.  No associated abscess.  Needs IV antibiotics and NPO except for ice chips and water until symptoms significantly improved.  Not likely to require surgery.    We will follow the patient while in the hosptial.  Jahna Liebert 10/10/2014, 5:37 PM

## 2014-10-10 NOTE — H&P (Signed)
[facility] History and Physical  Danny [name] ZHY:[id number] DOB: 09-20-1945 DOA: 10/10/2014  Referring physician: Gertie [name], [place] PCP: Danny Graves, DO   Chief Complaint: Abdominal pain  HPI: Danny [name] is a 69 y.o. male with PMH significant for chronic pain syndrome. PTSD, CAD, HTN who presents complaining of abdominal pain that started 5 days prior to admission. Pain is more pronounce left lower quadrant, worse with meals. Has had decrease appetite.  He denies nausea, vomiting, diarrhea. No chest pain or dyspnea.  Evaluation in the ED; WBC at 16, lipase normal, CT abdomen pelvis: CT imaging findings are most consistent with acute diverticulitis in the left lower quadrant at the junction of the descending and sigmoid colons. Additionally, there are multiple small locules of free air in the upper abdomen concerning for microperforation. No abscess collection. There is a trace volume of free fluid in the left paracolic gutter and secondary reactive inflammation of an adjacent loop of small bowel in the left lower quadrant. 2. There is a second segment of abnormally thickened and inflamed sigmoid colon in the deep anatomic pelvis positioned between the bladder and rectum. This may represent a second site of diverticulitis, or a separate segmental infectious/inflammatory colitis. Alternately, a colonic neoplasm is difficult to exclude entirely at this location. However, the electronic medical record reveals a negative colonoscopy within the last 3 years.   Review of Systems:  Negative, except as per HPI/    Past Medical History  Diagnosis Date  . Hyperlipemia   . CAD (coronary artery disease)   . DVT (deep venous thrombosis)   . Neuropathy   . Depression   . Alcohol abuse     hx of  . PTSD (post-traumatic stress disorder)   . Back pain   . Chronic pain syndrome   . Myocardial infarction     '99- MI   Past Surgical History  Procedure Laterality Date  . Tonsillectomy      . Appendectomy    . Neck surgery      fusion retained hardware  . Cardiac catheterization      '99-last done-released  . Coronary angioplasty      '99- [facility]  . Back surgery      x4  . Colonoscopy N/A 02/27/2013    Procedure: COLONOSCOPY;  Surgeon: Danny Castle, MD;  Location: [facility];  Service: Endoscopy;  Laterality: N/A;   Social History:  reports that he has been smoking Cigarettes.  He has a 1 pack-year smoking history. He has never used smokeless tobacco. He reports that he does not drink alcohol or use illicit drugs.  No Known Allergies  Family History  Problem Relation Age of Onset  . Heart disease Mother   . Colon cancer Neg Hx     Prior to Admission medications   Medication Sig Start Date End Date Taking? Authorizing Provider  atorvastatin (LIPITOR) 20 MG tablet Take 20 mg by mouth daily.    Historical Provider, MD  carisoprodol (SOMA) 350 MG tablet Take 350 mg by mouth at bedtime.     Historical Provider, MD  DULoxetine (CYMBALTA) 30 MG capsule Take 30 mg by mouth 2 (two) times daily.    Historical Provider, MD  fentaNYL (DURAGESIC - DOSED MCG/HR) 100 MCG/HR Place 1 patch onto the skin every 3 (three) days.     Historical Provider, MD  gabapentin (NEURONTIN) 800 MG tablet Take 800 mg by mouth 2 (two) times daily.     Historical Provider, MD  HYDROcodone-acetaminophen (  LORTAB) 7.5-500 MG per tablet Take 1 tablet by mouth every 8 (eight) hours as needed for pain. For pain.    Historical Provider, MD  ibuprofen (ADVIL,MOTRIN) 800 MG tablet Take 800 mg by mouth every 8 (eight) hours as needed for fever, headache or moderate pain.     Historical Provider, MD  omeprazole (PRILOSEC) 20 MG capsule Take 20 mg by mouth daily as needed (acid reflex).     Historical Provider, MD  promethazine (PHENERGAN) 25 MG tablet Take 25-50 mg by mouth 2 (two) times daily as needed for nausea or vomiting. For pain.    Historical Provider, MD  QUEtiapine (SEROQUEL) 50 MG tablet Take 50  mg by mouth at bedtime.    Historical Provider, MD  Vitamin D, Ergocalciferol, (DRISDOL) 50000 UNITS CAPS capsule Take 50,000 Units by mouth 2 (two) times a week.    Historical Provider, MD   Physical Exam: Filed Vitals:   10/10/14 1700 10/10/14 1715 10/10/14 1719 10/10/14 1730  BP: 154/74 141/71 141/71 146/73  Pulse: 85 86 87 83  Temp:      TempSrc:      Resp:   16   SpO2: 98% 99% 97% 99%    Wt Readings from Last 3 Encounters:  02/24/13 88.565 kg (195 lb 4 oz)  09/25/11 78.382 kg (172 lb 12.8 oz)  02/18/11 89.359 kg (197 lb)    General:  Appears calm and comfortable Eyes: PERRL, normal lids, irises & conjunctiva ENT: grossly normal hearing, lips & tongue Neck: no LAD, masses or thyromegaly Cardiovascular: RRR, no m/r/g. No LE edema. Telemetry: SR, no arrhythmias  Respiratory: CTA bilaterally, no w/r/r. Normal respiratory effort. Abdomen: soft, generalized tenderness, no rigidity Skin: no rash or induration seen on limited exam Musculoskeletal: grossly normal tone BUE/BLE Psychiatric: grossly normal mood and affect, speech fluent and appropriate Neurologic: grossly non-focal.          Labs on Admission:  Basic Metabolic Panel:  Recent Labs Lab 10/10/14 1245  NA 137  K 3.8  CL 97*  CO2 27  GLUCOSE 105*  BUN 14  CREATININE 0.94  CALCIUM 9.3   Liver Function Tests:  Recent Labs Lab 10/10/14 1245  AST 14*  ALT 17  ALKPHOS 102  BILITOT 0.8  PROT 7.3  ALBUMIN 3.4*    Recent Labs Lab 10/10/14 1245  LIPASE 14*   No results for input(s): AMMONIA in the last 168 hours. CBC:  Recent Labs Lab 10/10/14 1245  WBC 16.0*  NEUTROABS 14.2*  HGB 13.9  HCT 41.1  MCV 88.6  PLT 361   Cardiac Enzymes: No results for input(s): CKTOTAL, CKMB, CKMBINDEX, TROPONINI in the last 168 hours.  BNP (last 3 results) No results for input(s): BNP in the last 8760 hours.  ProBNP (last 3 results) No results for input(s): PROBNP in the last 8760 hours.  CBG: No  results for input(s): GLUCAP in the last 168 hours.  Radiological Exams on Admission: Ct Abdomen Pelvis W Contrast  10/10/2014   CLINICAL DATA:  69 year old male with generalized abdominal pain.  EXAM: CT ABDOMEN AND PELVIS WITH CONTRAST  TECHNIQUE: Multidetector CT imaging of the abdomen and pelvis was performed using the standard protocol following bolus administration of intravenous contrast.  CONTRAST:  155mL OMNIPAQUE IOHEXOL 300 MG/ML  SOLN  COMPARISON:  Prior CT abdomen/ pelvis 03/02/2013  FINDINGS: Lower Chest: The lung bases are clear. Visualized cardiac structures are within normal limits for size. No pericardial effusion. Unremarkable visualized distal thoracic esophagus.  Abdomen:  Scattered locules of free air are present throughout the upper abdomen in the perihepatic spaces, adjacent to the stomach, in the left subdiaphragmatic space and along the course of the omentum.  The stomach and duodenum appear intact without definitive wall thickening or evidence of perforation. There are 2 segments of sigmoid colon which are abnormally thickened including 1 in the deep anatomic pelvis position between the bladder and rectum, and a second more proximal segment in the left lower quadrant at the junction of the descending colon and sigmoid colon. Both regions of focal wall thickening are affiliated with inflammatory stranding in the adjacent pericolonic fat. No definite free air about either of these structures. The degree of inflammatory changes more prominent in the left lower quadrant at the junction of the descending and sigmoid colons. There may be trace free fluid within the left pericolic gutter. There is secondary reactive thickening of a loop of small bowel in the left lower quadrant adjacent to the inflamed segment of colon.  Unremarkable CT appearance of the spleen. The pancreas is largely fatty replaced. Low-attenuation nodularity both adrenal glands (more prominent on the right than the left)  suggests adrenal hyperplasia or multiple small adenomas. The appearance is unchanged dating back to November of 2014. Normal hepatic contour morphology. No discrete hepatic lesion. High attenuation material layers within the gallbladder lumen likely representing sludge and/or small stones. No intra or extrahepatic biliary ductal dilatation.  Stable cyst exophytic from the posterior aspect of the upper pole of the right kidney. Slight interval progression of nephrolithiasis in the lower pole of the right kidney. The largest lower pole stone and now measures up to 5 mm in diameter. There are at least 4 stones in the right lower pole. No definite stones on the left. No evidence of hydronephrosis. No enhancing renal mass.  Pelvis: Unremarkable bladder, prostate gland and seminal vesicles. No free fluid or adenopathy.  Bones/Soft Tissues: No acute fracture or aggressive appearing lytic or blastic osseous lesion. Extensive multilevel degenerative disc disease. Surgical changes of prior L4-S1 posterior lumbar interbody fusion with additional lateral fusion and interbody graft at L3-L4. Compression fractures at T12 and L1 with changes of prior augmentation at T12. Comparing across modalities to the recent lumbar spine MRI dated 09/25/2014 there has been no significant progression of height loss of the L1 compression fracture.  Vascular: Ectatic infrarenal abdominal aorta with a maximal diameter of 2.9 cm. Scattered atherosclerotic vascular calcifications. No focal venous abnormality. Main portal and splanchnic veins are patent.  IMPRESSION: 1. CT imaging findings are most consistent with acute diverticulitis in the left lower quadrant at the junction of the descending and sigmoid colons. Additionally, there are multiple small locules of free air in the upper abdomen concerning for microperforation. No abscess collection. There is a trace volume of free fluid in the left paracolic gutter and secondary reactive inflammation  of an adjacent loop of small bowel in the left lower quadrant. 2. There is a second segment of abnormally thickened and inflamed sigmoid colon in the deep anatomic pelvis positioned between the bladder and rectum. This may represent a second site of diverticulitis, or a separate segmental infectious/inflammatory colitis. Alternately, a colonic neoplasm is difficult to exclude entirely at this location. However, the electronic medical record reveals a negative colonoscopy within the last 3 years. 3. High attenuation material consistent was sludge and/or small stones layers within the gallbladder lumen. 4. Slight progression of right lower pole nephrolithiasis. No evidence of hydronephrosis. 5. Multilevel degenerative disc disease  status post posterior lumbar interbody fusion. 6. Stable L1 compression fracture without evidence of progressive height loss compared MRI 09/25/2014. 7. Ectatic infrarenal abdominal aorta with a maximal diameter of 2.9 cm. Ectatic abdominal aorta at risk for aneurysm development. Recommend followup by ultrasound in 5 years. This recommendation follows ACR consensus guidelines: White Paper of the ACR Incidental Findings Committee II on Vascular Findings. J Am Coll Radiol 2013; 10:789-794.   Electronically Signed   By: Jacqulynn Cadet M.D.   On: 10/10/2014 17:08    EKG: Independently reviewed. Sinus tachycardia  Assessment/Plan Active Problems:   Chronic pain syndrome   Coronary artery disease   Diverticulitis large intestine  1-Acute diverticulitis with microperforation; IV ciprofloxacin and flagyl.  NPO Surgery consulted, recommending medical management.  IV pain medication.   2-Chronic pain syndrome;  He is not taking soma, remove from med list.  Continue with fentanyl patch.  Resume oral opioid when tolerating diet.  IV morphine PRN.   3-Neuropathy; continue with gabapentin.   4-CAD; no chest pain.   Code Status: full code.  DVT Prophylaxis:lovenox Family  Communication: wife at bedside.  Disposition Plan: expect 3 to 4 day inpatient  Time spent: 75 minutes.   Niel Hummer A [facility] Pager [phone]

## 2014-10-10 NOTE — ED Notes (Cosign Needed)
Received report at beginning of shift from Danny Carnes, PA-C.  Danny [name] here with abd pain.  Has leukocytosis WBC 16.  Abd/pelvis CT with evidence of diverticulitis with microperf but no abscess.  Evidence of L1 compression fx, which Danny [name] is aware and currently f/u with neurosurgeon Dr. Vertell Limber.  Danny [name] will receive cipro/flagyl.  Will consult surgery for admission.    5:25 PM I have consulted with General Surgery and spoke with Dr. Hulen Skains who acknowledge the result and recommend for medical admission.  Will consult medicine for admission.  Danny [name] made aware of plan.    5:47 PM Appreciate consultation from [facility] Dr. Tyrell Antonio who agrees to admit Danny [name] to med surg, under her care.    General Surgery Dr. Hulen Skains did see Danny [name] in ER and will put a consult note as well.    BP 146/73 mmHg  Pulse 83  Temp(Src) 97.8 F (36.6 C) (Oral)  Resp 16  SpO2 99%  I have reviewed nursing notes and vital signs. I personally viewed the imaging tests through PACS system and agrees with radiologist's intepretation I reviewed available ER/hospitalization records through the EMR  Results for orders placed or performed during the hospital encounter of 10/10/14  CBC with Differential  Result Value Ref Range   WBC 16.0 (H) 4.0 - 10.5 K/uL   RBC 4.64 4.22 - 5.81 MIL/uL   Hemoglobin 13.9 13.0 - 17.0 g/dL   HCT 41.1 39.0 - 52.0 %   MCV 88.6 78.0 - 100.0 fL   MCH 30.0 26.0 - 34.0 pg   MCHC 33.8 30.0 - 36.0 g/dL   RDW 13.6 11.5 - 15.5 %   Platelets 361 150 - 400 K/uL   Neutrophils Relative % 89 (H) 43 - 77 %   Neutro Abs 14.2 (H) 1.7 - 7.7 K/uL   Lymphocytes Relative 5 (L) 12 - 46 %   Lymphs Abs 0.8 0.7 - 4.0 K/uL   Monocytes Relative 6 3 - 12 %   Monocytes Absolute 0.9 0.1 - 1.0 K/uL   Eosinophils Relative 0 0 - 5 %   Eosinophils Absolute 0.0 0.0 - 0.7 K/uL   Basophils Relative 0 0 - 1 %   Basophils Absolute 0.0 0.0 - 0.1 K/uL  Comprehensive metabolic panel  Result Value Ref Range   Sodium 137 135 - 145 mmol/L   Potassium 3.8 3.5 - 5.1 mmol/L   Chloride 97 (L) 101 - 111 mmol/L   CO2 27 22 - 32 mmol/L   Glucose, Bld 105 (H) 65 - 99 mg/dL   BUN 14 6 - 20 mg/dL   Creatinine, Ser 0.94 0.61 - 1.24 mg/dL   Calcium 9.3 8.9 - 10.3 mg/dL   Total Protein 7.3 6.5 - 8.1 g/dL   Albumin 3.4 (L) 3.5 - 5.0 g/dL   AST 14 (L) 15 - 41 U/L   ALT 17 17 - 63 U/L   Alkaline Phosphatase 102 38 - 126 U/L   Total Bilirubin 0.8 0.3 - 1.2 mg/dL   GFR calc non Af Amer >60 >60 mL/min   GFR calc Af Amer >60 >60 mL/min   Anion gap 13 5 - 15  Lipase, blood  Result Value Ref Range   Lipase 14 (L) 22 - 51 U/L  I-stat troponin, ED (only if Danny [name] is 69 y.o. or older & pain is above umbilicus)  not at [facility]  Result Value Ref Range   Troponin i, poc 0.00 0.00 - 0.08 ng/mL   Comment 3  Ct Abdomen Pelvis W Contrast  10/10/2014   CLINICAL DATA:  69 year old male with generalized abdominal pain.  EXAM: CT ABDOMEN AND PELVIS WITH CONTRAST  TECHNIQUE: Multidetector CT imaging of the abdomen and pelvis was performed using the standard protocol following bolus administration of intravenous contrast.  CONTRAST:  167mL OMNIPAQUE IOHEXOL 300 MG/ML  SOLN  COMPARISON:  Prior CT abdomen/ pelvis 03/02/2013  FINDINGS: Lower Chest: The lung bases are clear. Visualized cardiac structures are within normal limits for size. No pericardial effusion. Unremarkable visualized distal thoracic esophagus.  Abdomen: Scattered locules of free air are present throughout the upper abdomen in the perihepatic spaces, adjacent to the stomach, in the left subdiaphragmatic space and along the course of the omentum.  The stomach and duodenum appear intact without definitive wall thickening or evidence of perforation. There are 2 segments of sigmoid colon which are abnormally thickened including 1 in the deep anatomic pelvis position between the bladder and rectum, and a second more proximal segment in the left lower quadrant at the junction of the descending colon  and sigmoid colon. Both regions of focal wall thickening are affiliated with inflammatory stranding in the adjacent pericolonic fat. No definite free air about either of these structures. The degree of inflammatory changes more prominent in the left lower quadrant at the junction of the descending and sigmoid colons. There may be trace free fluid within the left pericolic gutter. There is secondary reactive thickening of a loop of small bowel in the left lower quadrant adjacent to the inflamed segment of colon.  Unremarkable CT appearance of the spleen. The pancreas is largely fatty replaced. Low-attenuation nodularity both adrenal glands (more prominent on the right than the left) suggests adrenal hyperplasia or multiple small adenomas. The appearance is unchanged dating back to November of 2014. Normal hepatic contour morphology. No discrete hepatic lesion. High attenuation material layers within the gallbladder lumen likely representing sludge and/or small stones. No intra or extrahepatic biliary ductal dilatation.  Stable cyst exophytic from the posterior aspect of the upper pole of the right kidney. Slight interval progression of nephrolithiasis in the lower pole of the right kidney. The largest lower pole stone and now measures up to 5 mm in diameter. There are at least 4 stones in the right lower pole. No definite stones on the left. No evidence of hydronephrosis. No enhancing renal mass.  Pelvis: Unremarkable bladder, prostate gland and seminal vesicles. No free fluid or adenopathy.  Bones/Soft Tissues: No acute fracture or aggressive appearing lytic or blastic osseous lesion. Extensive multilevel degenerative disc disease. Surgical changes of prior L4-S1 posterior lumbar interbody fusion with additional lateral fusion and interbody graft at L3-L4. Compression fractures at T12 and L1 with changes of prior augmentation at T12. Comparing across modalities to the recent lumbar spine MRI dated 09/25/2014 there  has been no significant progression of height loss of the L1 compression fracture.  Vascular: Ectatic infrarenal abdominal aorta with a maximal diameter of 2.9 cm. Scattered atherosclerotic vascular calcifications. No focal venous abnormality. Main portal and splanchnic veins are patent.  IMPRESSION: 1. CT imaging findings are most consistent with acute diverticulitis in the left lower quadrant at the junction of the descending and sigmoid colons. Additionally, there are multiple small locules of free air in the upper abdomen concerning for microperforation. No abscess collection. There is a trace volume of free fluid in the left paracolic gutter and secondary reactive inflammation of an adjacent loop of small bowel in the left lower quadrant. 2. There  is a second segment of abnormally thickened and inflamed sigmoid colon in the deep anatomic pelvis positioned between the bladder and rectum. This may represent a second site of diverticulitis, or a separate segmental infectious/inflammatory colitis. Alternately, a colonic neoplasm is difficult to exclude entirely at this location. However, the electronic medical record reveals a negative colonoscopy within the last 3 years. 3. High attenuation material consistent was sludge and/or small stones layers within the gallbladder lumen. 4. Slight progression of right lower pole nephrolithiasis. No evidence of hydronephrosis. 5. Multilevel degenerative disc disease status post posterior lumbar interbody fusion. 6. Stable L1 compression fracture without evidence of progressive height loss compared MRI 09/25/2014. 7. Ectatic infrarenal abdominal aorta with a maximal diameter of 2.9 cm. Ectatic abdominal aorta at risk for aneurysm development. Recommend followup by ultrasound in 5 years. This recommendation follows ACR consensus guidelines: White Paper of the ACR Incidental Findings Committee II on Vascular Findings. J Am Coll Radiol 2013; 10:789-794.   Electronically Signed    By: Jacqulynn Cadet M.D.   On: 10/10/2014 17:08      Domenic Moras, PA-C 10/10/14 1748

## 2014-10-10 NOTE — ED Notes (Signed)
Attempted report to 6N for second time. No RN available to accept report. Will take pt up and give bedside report.

## 2014-10-10 NOTE — ED Notes (Signed)
Pt has a 75mg  transdermal morphine patch. Pt wishes to keep the patch on and opt for a different pain medication other than IV morphine. MD prescribed fentanyl instead.

## 2014-10-10 NOTE — ED Provider Notes (Signed)
CSN: [id number]     Arrival date & time 10/10/14  1208 History   First MD Initiated Contact with Patient 10/10/14 1430     Chief Complaint  Patient presents with  . Abdominal Pain     (Consider location/radiation/quality/duration/timing/severity/associated sxs/prior Treatment) Patient is a 69 y.o. male presenting with abdominal pain. The history is provided by the patient and medical records.  Abdominal Pain   This is a 69 year old male with past medical history significant for hyperlipidemia, coronary artery disease, depression, PTSD, chronic pain, presenting to the ED for abdominal pain.  Patient states for the past 5 days he has had persistent abdominal pain, progressively worsening. Pain is generalized but seems worse in his left lower abdomen. Pain is sharp, stabbing. He denies any associated nausea, vomiting, or diarrhea. He has had decreased appetite and poor by mouth intake as he states his pain is worsened when eating and drinking. He has been able to drink small amounts of water. Wife did note subjective fever a few days ago, did not formally check temperature. No chills or sweats. No urinary symptoms. Last bowel movement was earlier today which was normal. No melena or hematochezia. Prior abdominal surgeries include appendectomy.  Patient is followed by GI, Dr. Deatra Ina. He had a colonoscopy a few years ago and polyps removed. He has no known history of diverticulosis to his knowledge.  VSS.  Past Medical History  Diagnosis Date  . Hyperlipemia   . CAD (coronary artery disease)   . DVT (deep venous thrombosis)   . Neuropathy   . Depression   . Alcohol abuse     hx of  . PTSD (post-traumatic stress disorder)   . Back pain   . Chronic pain syndrome   . Myocardial infarction     '99- MI   Past Surgical History  Procedure Laterality Date  . Tonsillectomy    . Appendectomy    . Neck surgery      fusion retained hardware  . Cardiac catheterization      '99-last done-released   . Coronary angioplasty      '99- [facility]  . Back surgery      x4  . Colonoscopy N/A 02/27/2013    Procedure: COLONOSCOPY;  Surgeon: Inda Castle, MD;  Location: [facility];  Service: Endoscopy;  Laterality: N/A;   Family History  Problem Relation Age of Onset  . Heart disease Mother   . Colon cancer Neg Hx    History  Substance Use Topics  . Smoking status: Current Every Day Smoker -- 1.00 packs/day for 1 years    Types: Cigarettes  . Smokeless tobacco: Never Used  . Alcohol Use: No     Comment: former alcoholic [id number]    Review of Systems  Constitutional: Positive for appetite change.  Gastrointestinal: Positive for abdominal pain.  All other systems reviewed and are negative.     Allergies  Review of patient's allergies indicates no known allergies.  Home Medications   Prior to Admission medications   Medication Sig Start Date End Date Taking? Authorizing Provider  atorvastatin (LIPITOR) 20 MG tablet Take 20 mg by mouth daily.    Historical Provider, MD  carisoprodol (SOMA) 350 MG tablet Take 350 mg by mouth at bedtime.     Historical Provider, MD  DULoxetine (CYMBALTA) 30 MG capsule Take 30 mg by mouth 2 (two) times daily.    Historical Provider, MD  fentaNYL (DURAGESIC - DOSED MCG/HR) 100 MCG/HR Place 1 patch onto the skin every  3 (three) days.     Historical Provider, MD  gabapentin (NEURONTIN) 800 MG tablet Take 800 mg by mouth 2 (two) times daily.     Historical Provider, MD  HYDROcodone-acetaminophen (LORTAB) 7.5-500 MG per tablet Take 1 tablet by mouth every 8 (eight) hours as needed for pain. For pain.    Historical Provider, MD  ibuprofen (ADVIL,MOTRIN) 800 MG tablet Take 800 mg by mouth every 8 (eight) hours as needed for fever, headache or moderate pain.     Historical Provider, MD  omeprazole (PRILOSEC) 20 MG capsule Take 20 mg by mouth daily as needed (acid reflex).     Historical Provider, MD  promethazine (PHENERGAN) 25 MG tablet Take 25-50 mg by  mouth 2 (two) times daily as needed for nausea or vomiting. For pain.    Historical Provider, MD  QUEtiapine (SEROQUEL) 50 MG tablet Take 50 mg by mouth at bedtime.    Historical Provider, MD  Vitamin D, Ergocalciferol, (DRISDOL) 50000 UNITS CAPS capsule Take 50,000 Units by mouth 2 (two) times a week.    Historical Provider, MD   BP 128/72 mmHg  Pulse 91  Temp(Src) 97.8 F (36.6 C) (Oral)  Resp 18  SpO2 100%   Physical Exam  Constitutional: He is oriented to person, place, and time. He appears well-developed and well-nourished.  Chronically ill appearing  HENT:  Head: Normocephalic and atraumatic.  Mouth/Throat: Oropharynx is clear and moist.  Eyes: Conjunctivae and EOM are normal. Pupils are equal, round, and reactive to light.  Neck: Normal range of motion.  Cardiovascular: Normal rate, regular rhythm and normal heart sounds.   Pulmonary/Chest: Effort normal and breath sounds normal. No respiratory distress. He has no wheezes.  Abdominal: Soft. Bowel sounds are normal. There is generalized tenderness. There is guarding. There is no rigidity and no CVA tenderness.  Abdomen soft, nondistended, generalized tenderness but seems more pronounced in left lower quadrant; voluntary guarding  Musculoskeletal: Normal range of motion.  Neurological: He is alert and oriented to person, place, and time.  Skin: Skin is warm and dry.  Psychiatric: He has a normal mood and affect.  Nursing note and vitals reviewed.   ED Course  Procedures (including critical care time) Labs Review Labs Reviewed  CBC WITH DIFFERENTIAL/PLATELET - Abnormal; Notable for the following:    WBC 16.0 (*)    Neutrophils Relative % 89 (*)    Neutro Abs 14.2 (*)    Lymphocytes Relative 5 (*)    All other components within normal limits  COMPREHENSIVE METABOLIC PANEL - Abnormal; Notable for the following:    Chloride 97 (*)    Glucose, Bld 105 (*)    Albumin 3.4 (*)    AST 14 (*)    All other components within  normal limits  LIPASE, BLOOD - Abnormal; Notable for the following:    Lipase 14 (*)    All other components within normal limits  I-STAT TROPOININ, ED    Imaging Review Ct Abdomen Pelvis W Contrast  10/10/2014   CLINICAL DATA:  69 year old male with generalized abdominal pain.  EXAM: CT ABDOMEN AND PELVIS WITH CONTRAST  TECHNIQUE: Multidetector CT imaging of the abdomen and pelvis was performed using the standard protocol following bolus administration of intravenous contrast.  CONTRAST:  133mL OMNIPAQUE IOHEXOL 300 MG/ML  SOLN  COMPARISON:  Prior CT abdomen/ pelvis 03/02/2013  FINDINGS: Lower Chest: The lung bases are clear. Visualized cardiac structures are within normal limits for size. No pericardial effusion. Unremarkable visualized distal thoracic  esophagus.  Abdomen: Scattered locules of free air are present throughout the upper abdomen in the perihepatic spaces, adjacent to the stomach, in the left subdiaphragmatic space and along the course of the omentum.  The stomach and duodenum appear intact without definitive wall thickening or evidence of perforation. There are 2 segments of sigmoid colon which are abnormally thickened including 1 in the deep anatomic pelvis position between the bladder and rectum, and a second more proximal segment in the left lower quadrant at the junction of the descending colon and sigmoid colon. Both regions of focal wall thickening are affiliated with inflammatory stranding in the adjacent pericolonic fat. No definite free air about either of these structures. The degree of inflammatory changes more prominent in the left lower quadrant at the junction of the descending and sigmoid colons. There may be trace free fluid within the left pericolic gutter. There is secondary reactive thickening of a loop of small bowel in the left lower quadrant adjacent to the inflamed segment of colon.  Unremarkable CT appearance of the spleen. The pancreas is largely fatty replaced.  Low-attenuation nodularity both adrenal glands (more prominent on the right than the left) suggests adrenal hyperplasia or multiple small adenomas. The appearance is unchanged dating back to November of 2014. Normal hepatic contour morphology. No discrete hepatic lesion. High attenuation material layers within the gallbladder lumen likely representing sludge and/or small stones. No intra or extrahepatic biliary ductal dilatation.  Stable cyst exophytic from the posterior aspect of the upper pole of the right kidney. Slight interval progression of nephrolithiasis in the lower pole of the right kidney. The largest lower pole stone and now measures up to 5 mm in diameter. There are at least 4 stones in the right lower pole. No definite stones on the left. No evidence of hydronephrosis. No enhancing renal mass.  Pelvis: Unremarkable bladder, prostate gland and seminal vesicles. No free fluid or adenopathy.  Bones/Soft Tissues: No acute fracture or aggressive appearing lytic or blastic osseous lesion. Extensive multilevel degenerative disc disease. Surgical changes of prior L4-S1 posterior lumbar interbody fusion with additional lateral fusion and interbody graft at L3-L4. Compression fractures at T12 and L1 with changes of prior augmentation at T12. Comparing across modalities to the recent lumbar spine MRI dated 09/25/2014 there has been no significant progression of height loss of the L1 compression fracture.  Vascular: Ectatic infrarenal abdominal aorta with a maximal diameter of 2.9 cm. Scattered atherosclerotic vascular calcifications. No focal venous abnormality. Main portal and splanchnic veins are patent.  IMPRESSION: 1. CT imaging findings are most consistent with acute diverticulitis in the left lower quadrant at the junction of the descending and sigmoid colons. Additionally, there are multiple small locules of free air in the upper abdomen concerning for microperforation. No abscess collection. There is a  trace volume of free fluid in the left paracolic gutter and secondary reactive inflammation of an adjacent loop of small bowel in the left lower quadrant. 2. There is a second segment of abnormally thickened and inflamed sigmoid colon in the deep anatomic pelvis positioned between the bladder and rectum. This may represent a second site of diverticulitis, or a separate segmental infectious/inflammatory colitis. Alternately, a colonic neoplasm is difficult to exclude entirely at this location. However, the electronic medical record reveals a negative colonoscopy within the last 3 years. 3. High attenuation material consistent was sludge and/or small stones layers within the gallbladder lumen. 4. Slight progression of right lower pole nephrolithiasis. No evidence of hydronephrosis. 5. Multilevel  degenerative disc disease status post posterior lumbar interbody fusion. 6. Stable L1 compression fracture without evidence of progressive height loss compared MRI 09/25/2014. 7. Ectatic infrarenal abdominal aorta with a maximal diameter of 2.9 cm. Ectatic abdominal aorta at risk for aneurysm development. Recommend followup by ultrasound in 5 years. This recommendation follows ACR consensus guidelines: White Paper of the ACR Incidental Findings Committee II on Vascular Findings. J Am Coll Radiol 2013; 10:789-794.   Electronically Signed   By: Jacqulynn Cadet M.D.   On: 10/10/2014 17:08     EKG Interpretation None      MDM   Final diagnoses:  Abdominal pain  Diverticulitis of large intestine with perforation without bleeding   69 year old male here with 5 days of abdominal pain. Patient afebrile, nontoxic. He has generalized tenderness, seems worse in left lower quadrant. He has voluntary guarding. Labwork as above, leukocytosis noted.  CT scan was obtained, diverticulitis noted with concern for microperforations. Patient will be started on IV cipro/flagyl and admitted for further management.  Larene Pickett,  PA-C 10/10/14 [place], MD 10/12/14 [phone]

## 2014-10-10 NOTE — ED Notes (Signed)
Bedside report given to [employer] on 6N.

## 2014-10-10 NOTE — ED Notes (Signed)
Admitting at bedside 

## 2014-10-10 NOTE — ED Notes (Signed)
Attempted report to 6N 

## 2014-10-10 NOTE — ED Notes (Signed)
Pt undressed, in gown, on continuous pulse oximetry and blood pressure cuff; visitor at bedside

## 2014-10-10 NOTE — ED Notes (Signed)
To ED via private vehicle from Dr. Oralia Rud office to be seen by Dr. Deatra Ina (GI). Pt is alert, c/o abd pain, denies nausea/vomiting/ diarrhea. Has hx fractured vertebrae approx 1 month ago.

## 2014-10-11 ENCOUNTER — Encounter (HOSPITAL_COMMUNITY): Payer: Self-pay | Admitting: General Practice

## 2014-10-11 DIAGNOSIS — K5720 Diverticulitis of large intestine with perforation and abscess without bleeding: Principal | ICD-10-CM

## 2014-10-11 DIAGNOSIS — E876 Hypokalemia: Secondary | ICD-10-CM

## 2014-10-11 DIAGNOSIS — I2510 Atherosclerotic heart disease of native coronary artery without angina pectoris: Secondary | ICD-10-CM

## 2014-10-11 DIAGNOSIS — G894 Chronic pain syndrome: Secondary | ICD-10-CM

## 2014-10-11 LAB — COMPREHENSIVE METABOLIC PANEL
ALBUMIN: 2.6 g/dL — AB (ref 3.5–5.0)
ALT: 12 U/L — AB (ref 17–63)
AST: 11 U/L — ABNORMAL LOW (ref 15–41)
Alkaline Phosphatase: 83 U/L (ref 38–126)
Anion gap: 6 (ref 5–15)
BUN: 10 mg/dL (ref 6–20)
CO2: 31 mmol/L (ref 22–32)
CREATININE: 0.7 mg/dL (ref 0.61–1.24)
Calcium: 8.6 mg/dL — ABNORMAL LOW (ref 8.9–10.3)
Chloride: 102 mmol/L (ref 101–111)
GFR calc non Af Amer: >60 mL/min (ref >60)
GLUCOSE: 94 mg/dL (ref 65–99)
POTASSIUM: 3.1 mmol/L — AB (ref 3.5–5.1)
SODIUM: 139 mmol/L (ref 135–145)
TOTAL PROTEIN: 5.8 g/dL — AB (ref 6.5–8.1)
Total Bilirubin: 0.6 mg/dL (ref 0.3–1.2)

## 2014-10-11 LAB — CBC
HEMATOCRIT: 36.7 % — AB (ref 39.0–52.0)
Hemoglobin: 12 g/dL — ABNORMAL LOW (ref 13.0–17.0)
MCH: 29.1 pg (ref 26.0–34.0)
MCHC: 32.7 g/dL (ref 30.0–36.0)
MCV: 88.9 fL (ref 78.0–100.0)
Platelets: 303 10*3/uL (ref 150–400)
RBC: 4.13 MIL/uL — ABNORMAL LOW (ref 4.22–5.81)
RDW: 13.6 % (ref 11.5–15.5)
WBC: 9.7 10*3/uL (ref 4.0–10.5)

## 2014-10-11 MED ORDER — DIAZEPAM 2 MG PO TABS
2.0000 mg | ORAL_TABLET | Freq: Two times a day (BID) | ORAL | Status: DC
  Administered 2014-10-11 – 2014-10-13 (×5): 2 mg via ORAL
  Filled 2014-10-11 (×5): qty 1

## 2014-10-11 MED ORDER — DEXTROSE 5 % IV SOLN
INTRAVENOUS | Status: AC
  Administered 2014-10-11 – 2014-10-13 (×4): via INTRAVENOUS
  Filled 2014-10-11 (×6): qty 1000

## 2014-10-11 NOTE — Progress Notes (Signed)
[facility] Progress Note     Subjective: Pt doing okay, still has pain in LLQ and suprapubic region. No N/V, no fever/chills.  Really thirsty and wants to drink.  Not been OOB yet.  Urinating well.    Objective: Vital signs in last 24 hours: Temp:  [97.8 F (36.6 C)-98.2 F (36.8 C)] 97.9 F (36.6 C) (06/30 0610) Pulse Rate:  [78-104] 78 (06/30 0610) Resp:  [16-22] 18 (06/30 0610) BP: (102-154)/(55-75) 102/55 mmHg (06/30 0610) SpO2:  [97 %-100 %] 97 % (06/30 0610) Last BM Date: 10/10/14  Intake/Output from previous day: 06/29 0701 - 06/30 0700 In: 863.3 [I.V.:863.3] Out: 1550 [Urine:1550] Intake/Output this shift:    PE: Gen:  Alert, NAD, pleasant Abd: Soft, ND, tender in LLQ and suprapubic region, +BS, no HSM   Lab Results:   Recent Labs  10/10/14 1245 10/11/14 0315  WBC 16.0* 9.7  HGB 13.9 12.0*  HCT 41.1 36.7*  PLT 361 303   BMET  Recent Labs  10/10/14 1245 10/11/14 0315  NA 137 139  K 3.8 3.1*  CL 97* 102  CO2 27 31  GLUCOSE 105* 94  BUN 14 10  CREATININE 0.94 0.70  CALCIUM 9.3 8.6*   PT/INR No results for input(s): LABPROT, INR in the last 72 hours. CMP     Component Value Date/Time   NA 139 10/11/2014 0315   K 3.1* 10/11/2014 0315   CL 102 10/11/2014 0315   CO2 31 10/11/2014 0315   GLUCOSE 94 10/11/2014 0315   BUN 10 10/11/2014 0315   CREATININE 0.70 10/11/2014 0315   CALCIUM 8.6* 10/11/2014 0315   PROT 5.8* 10/11/2014 0315   ALBUMIN 2.6* 10/11/2014 0315   AST 11* 10/11/2014 0315   ALT 12* 10/11/2014 0315   ALKPHOS 83 10/11/2014 0315   BILITOT 0.6 10/11/2014 0315   GFRNONAA >60 10/11/2014 0315   GFRAA >60 10/11/2014 0315   Lipase     Component Value Date/Time   LIPASE 14* 10/10/2014 1245       Studies/Results: Ct Abdomen Pelvis W Contrast  10/10/2014   CLINICAL DATA:  69 year old male with generalized abdominal pain.  EXAM: CT ABDOMEN AND PELVIS WITH CONTRAST  TECHNIQUE: Multidetector CT imaging of the  abdomen and pelvis was performed using the standard protocol following bolus administration of intravenous contrast.  CONTRAST:  165mL OMNIPAQUE IOHEXOL 300 MG/ML  SOLN  COMPARISON:  Prior CT abdomen/ pelvis 03/02/2013  FINDINGS: Lower Chest: The lung bases are clear. Visualized cardiac structures are within normal limits for size. No pericardial effusion. Unremarkable visualized distal thoracic esophagus.  Abdomen: Scattered locules of free air are present throughout the upper abdomen in the perihepatic spaces, adjacent to the stomach, in the left subdiaphragmatic space and along the course of the omentum.  The stomach and duodenum appear intact without definitive wall thickening or evidence of perforation. There are 2 segments of sigmoid colon which are abnormally thickened including 1 in the deep anatomic pelvis position between the bladder and rectum, and a second more proximal segment in the left lower quadrant at the junction of the descending colon and sigmoid colon. Both regions of focal wall thickening are affiliated with inflammatory stranding in the adjacent pericolonic fat. No definite free air about either of these structures. The degree of inflammatory changes more prominent in the left lower quadrant at the junction of the descending and sigmoid colons. There may be trace free fluid within the left pericolic gutter. There is secondary reactive thickening of a  loop of small bowel in the left lower quadrant adjacent to the inflamed segment of colon.  Unremarkable CT appearance of the spleen. The pancreas is largely fatty replaced. Low-attenuation nodularity both adrenal glands (more prominent on the right than the left) suggests adrenal hyperplasia or multiple small adenomas. The appearance is unchanged dating back to November of 2014. Normal hepatic contour morphology. No discrete hepatic lesion. High attenuation material layers within the gallbladder lumen likely representing sludge and/or small  stones. No intra or extrahepatic biliary ductal dilatation.  Stable cyst exophytic from the posterior aspect of the upper pole of the right kidney. Slight interval progression of nephrolithiasis in the lower pole of the right kidney. The largest lower pole stone and now measures up to 5 mm in diameter. There are at least 4 stones in the right lower pole. No definite stones on the left. No evidence of hydronephrosis. No enhancing renal mass.  Pelvis: Unremarkable bladder, prostate gland and seminal vesicles. No free fluid or adenopathy.  Bones/Soft Tissues: No acute fracture or aggressive appearing lytic or blastic osseous lesion. Extensive multilevel degenerative disc disease. Surgical changes of prior L4-S1 posterior lumbar interbody fusion with additional lateral fusion and interbody graft at L3-L4. Compression fractures at T12 and L1 with changes of prior augmentation at T12. Comparing across modalities to the recent lumbar spine MRI dated 09/25/2014 there has been no significant progression of height loss of the L1 compression fracture.  Vascular: Ectatic infrarenal abdominal aorta with a maximal diameter of 2.9 cm. Scattered atherosclerotic vascular calcifications. No focal venous abnormality. Main portal and splanchnic veins are patent.  IMPRESSION: 1. CT imaging findings are most consistent with acute diverticulitis in the left lower quadrant at the junction of the descending and sigmoid colons. Additionally, there are multiple small locules of free air in the upper abdomen concerning for microperforation. No abscess collection. There is a trace volume of free fluid in the left paracolic gutter and secondary reactive inflammation of an adjacent loop of small bowel in the left lower quadrant. 2. There is a second segment of abnormally thickened and inflamed sigmoid colon in the deep anatomic pelvis positioned between the bladder and rectum. This may represent a second site of diverticulitis, or a separate  segmental infectious/inflammatory colitis. Alternately, a colonic neoplasm is difficult to exclude entirely at this location. However, the electronic medical record reveals a negative colonoscopy within the last 3 years. 3. High attenuation material consistent was sludge and/or small stones layers within the gallbladder lumen. 4. Slight progression of right lower pole nephrolithiasis. No evidence of hydronephrosis. 5. Multilevel degenerative disc disease status post posterior lumbar interbody fusion. 6. Stable L1 compression fracture without evidence of progressive height loss compared MRI 09/25/2014. 7. Ectatic infrarenal abdominal aorta with a maximal diameter of 2.9 cm. Ectatic abdominal aorta at risk for aneurysm development. Recommend followup by ultrasound in 5 years. This recommendation follows ACR consensus guidelines: White Paper of the ACR Incidental Findings Committee II on Vascular Findings. J Am Coll Radiol 2013; 10:789-794.   Electronically Signed   By: Jacqulynn Cadet M.D.   On: 10/10/2014 17:08    Anti-infectives: Anti-infectives    Start     Dose/Rate Route Frequency Ordered Stop   10/10/14 1815  ciprofloxacin (CIPRO) IVPB 400 mg     400 mg 200 mL/hr over 60 Minutes Intravenous Every 12 hours 10/10/14 1812     10/10/14 1815  metroNIDAZOLE (FLAGYL) IVPB 500 mg     500 mg 100 mL/hr over 60 Minutes  Intravenous Every 8 hours 10/10/14 1813     10/10/14 1715  ciprofloxacin (CIPRO) IVPB 400 mg     400 mg 200 mL/hr over 60 Minutes Intravenous  Once 10/10/14 1712     10/10/14 1715  metroNIDAZOLE (FLAGYL) IVPB 500 mg     500 mg 100 mL/hr over 60 Minutes Intravenous  Once 10/10/14 1712 10/10/14 1834       Assessment/Plan Acute diverticulitis with microperf -IV antibiotics (cipro/flagyl Day #1), IVF, pain control, antiemetics, bowel rest/NPO -Doesn't appear to have a drainable abscess -Await improvement in pain prior to advancing diet (only ice chips for now) -Ambulate and  IS -SCD's and lovenox -Hopefully he will resolve with medical management and avoid surgical interventions -Appreciate medicines management of this patient    LOS: 1 day    Nat Christen 10/11/2014, 8:05 AM Pager: [phone]

## 2014-10-11 NOTE — Care Management Note (Signed)
Case Management Note  Patient Details  Name: JAROME TRULL MRN: [id number] Date of Birth: January 17, 1946  Subjective/Objective:                    Action/Plan:  UR completed.  Expected Discharge Date:       10-14-14            Expected Discharge Plan:  Home/Self Care  In-House Referral:     Discharge planning Services     Post Acute Care Choice:    Choice offered to:     DME Arranged:    DME Agency:     HH Arranged:    HH Agency:     Status of Service:  In process, will continue to follow  Medicare Important Message Given:    Date Medicare IM Given:    Medicare IM give by:    Date Additional Medicare IM Given:    Additional Medicare Important Message give by:     If discussed at [place] of Stay Meetings, dates discussed:    Additional Comments:  Marilu Favre, RN 10/11/2014, 10:41 AM

## 2014-10-11 NOTE — Progress Notes (Addendum)
Patient Demographics:    Danny [name], is a 69 y.o. male, DOB - [birth year], XKP:[id number]  Admit date - 10/10/2014   Admitting Physician Danny Shiley, MD  Outpatient Primary MD for the patient is Danny [name], [place], DO  LOS - 1   Chief Complaint  Patient presents with  . Abdominal Pain        Subjective:    Danny [name] today has, No headache, No chest pain, mild left lower quadrant abdominal pain - No Nausea, No new weakness tingling or numbness, No Cough - SOB.     Assessment  & Plan :     1. Acute diverticulitis microperforation and questionable mass. Much improved with bowel rest, IV fluids and IV antiemetics. General surgery following. Once better will eventually require colonoscopy. Follows with Dr. Deatra Ina, for outpatient colonoscopy 1 year ago was unremarkable except for 2 polyps which were removed.   2. Hypokalemia replace IV.   3. Chronic pain syndrome. Supportive care.    4. Peripheral neuropathy on Neurontin.     5.CAD - no acute issues.    Code Status : Full  Family Communication  : None present  Disposition Plan  : Home once better  Consults  :  Surgery  Procedures  : CT scan of abdomen and pelvis  DVT Prophylaxis  :  Lovenox    Lab Results  Component Value Date   PLT 303 10/11/2014    Inpatient Medications  Scheduled Meds: . ciprofloxacin  400 mg Intravenous Once  . ciprofloxacin  400 mg Intravenous Q12H  . diazepam  2 mg Oral BID  . enoxaparin (LOVENOX) injection  40 mg Subcutaneous Q24H  . fentaNYL  75 mcg Transdermal Q72H  . gabapentin  800 mg Oral BID  . metronidazole  500 mg Intravenous Q8H  . QUEtiapine  50 mg Oral QHS   Continuous Infusions: . dextrose 5 % and 0.45 % NaCl with KCl 10 mEq/L 100 mL/hr at 10/10/14 2122   PRN  Meds:.acetaminophen **OR** acetaminophen, morphine injection, ondansetron **OR** ondansetron (ZOFRAN) IV  Antibiotics  :     Anti-infectives    Start     Dose/Rate Route Frequency Ordered Stop   10/10/14 1815  ciprofloxacin (CIPRO) IVPB 400 mg     400 mg 200 mL/hr over 60 Minutes Intravenous Every 12 hours 10/10/14 1812     10/10/14 1815  metroNIDAZOLE (FLAGYL) IVPB 500 mg     500 mg 100 mL/hr over 60 Minutes Intravenous Every 8 hours 10/10/14 1813     10/10/14 1715  ciprofloxacin (CIPRO) IVPB 400 mg     400 mg 200 mL/hr over 60 Minutes Intravenous  Once 10/10/14 1712     10/10/14 1715  metroNIDAZOLE (FLAGYL) IVPB 500 mg     500 mg 100 mL/hr over 60 Minutes Intravenous  Once 10/10/14 1712 10/10/14 1834        Objective:   Filed Vitals:   10/10/14 1815 10/10/14 1830 10/10/14 1906 10/11/14 0610  BP: 144/71 138/61 146/60 102/55  Pulse: 80 82 84 78  Temp:   98.2 F (36.8 C) 97.9 F (36.6 C)  TempSrc:   Oral Axillary  Resp:   18 18  SpO2: 98% 98% 97% 97%    Wt Readings from  Last 3 Encounters:  02/24/13 88.565 kg (195 lb 4 oz)  09/25/11 78.382 kg (172 lb 12.8 oz)  02/18/11 89.359 kg (197 lb)     Intake/Output Summary (Last 24 hours) at 10/11/14 1112 Last data filed at 10/11/14 0900  Gross per 24 hour  Intake 923.33 ml  Output   1950 ml  Net -1026.67 ml     Physical Exam  Awake Alert, Oriented X 3, No new F.N deficits, Normal affect [place].AT,PERRAL Supple Neck,No JVD, No cervical lymphadenopathy appriciated.  Symmetrical Chest wall movement, Good air movement bilaterally, CTAB RRR,No Gallops,Rubs or new Murmurs, No Parasternal Heave +ve B.Sounds, Abd Soft, No tenderness, No organomegaly appriciated, No rebound - guarding or rigidity. No Cyanosis, Clubbing or edema, No new Rash or bruise       Data Review:   Micro Results No results found for this or any previous visit (from the past 240 hour(s)).  Radiology Reports Ct Abdomen Pelvis W Contrast  10/10/2014    CLINICAL DATA:  69 year old male with generalized abdominal pain.  EXAM: CT ABDOMEN AND PELVIS WITH CONTRAST  TECHNIQUE: Multidetector CT imaging of the abdomen and pelvis was performed using the standard protocol following bolus administration of intravenous contrast.  CONTRAST:  191mL OMNIPAQUE IOHEXOL 300 MG/ML  SOLN  COMPARISON:  Prior CT abdomen/ pelvis 03/02/2013  FINDINGS: Lower Chest: The lung bases are clear. Visualized cardiac structures are within normal limits for size. No pericardial effusion. Unremarkable visualized distal thoracic esophagus.  Abdomen: Scattered locules of free air are present throughout the upper abdomen in the perihepatic spaces, adjacent to the stomach, in the left subdiaphragmatic space and along the course of the omentum.  The stomach and duodenum appear intact without definitive wall thickening or evidence of perforation. There are 2 segments of sigmoid colon which are abnormally thickened including 1 in the deep anatomic pelvis position between the bladder and rectum, and a second more proximal segment in the left lower quadrant at the junction of the descending colon and sigmoid colon. Both regions of focal wall thickening are affiliated with inflammatory stranding in the adjacent pericolonic fat. No definite free air about either of these structures. The degree of inflammatory changes more prominent in the left lower quadrant at the junction of the descending and sigmoid colons. There may be trace free fluid within the left pericolic gutter. There is secondary reactive thickening of a loop of small bowel in the left lower quadrant adjacent to the inflamed segment of colon.  Unremarkable CT appearance of the spleen. The pancreas is largely fatty replaced. Low-attenuation nodularity both adrenal glands (more prominent on the right than the left) suggests adrenal hyperplasia or multiple small adenomas. The appearance is unchanged dating back to November of 2014. Normal hepatic  contour morphology. No discrete hepatic lesion. High attenuation material layers within the gallbladder lumen likely representing sludge and/or small stones. No intra or extrahepatic biliary ductal dilatation.  Stable cyst exophytic from the posterior aspect of the upper pole of the right kidney. Slight interval progression of nephrolithiasis in the lower pole of the right kidney. The largest lower pole stone and now measures up to 5 mm in diameter. There are at least 4 stones in the right lower pole. No definite stones on the left. No evidence of hydronephrosis. No enhancing renal mass.  Pelvis: Unremarkable bladder, prostate gland and seminal vesicles. No free fluid or adenopathy.  Bones/Soft Tissues: No acute fracture or aggressive appearing lytic or blastic osseous lesion. Extensive multilevel degenerative disc disease.  Surgical changes of prior L4-S1 posterior lumbar interbody fusion with additional lateral fusion and interbody graft at L3-L4. Compression fractures at T12 and L1 with changes of prior augmentation at T12. Comparing across modalities to the recent lumbar spine MRI dated 09/25/2014 there has been no significant progression of height loss of the L1 compression fracture.  Vascular: Ectatic infrarenal abdominal aorta with a maximal diameter of 2.9 cm. Scattered atherosclerotic vascular calcifications. No focal venous abnormality. Main portal and splanchnic veins are patent.  IMPRESSION: 1. CT imaging findings are most consistent with acute diverticulitis in the left lower quadrant at the junction of the descending and sigmoid colons. Additionally, there are multiple small locules of free air in the upper abdomen concerning for microperforation. No abscess collection. There is a trace volume of free fluid in the left paracolic gutter and secondary reactive inflammation of an adjacent loop of small bowel in the left lower quadrant. 2. There is a second segment of abnormally thickened and inflamed  sigmoid colon in the deep anatomic pelvis positioned between the bladder and rectum. This may represent a second site of diverticulitis, or a separate segmental infectious/inflammatory colitis. Alternately, a colonic neoplasm is difficult to exclude entirely at this location. However, the electronic medical record reveals a negative colonoscopy within the last 3 years. 3. High attenuation material consistent was sludge and/or small stones layers within the gallbladder lumen. 4. Slight progression of right lower pole nephrolithiasis. No evidence of hydronephrosis. 5. Multilevel degenerative disc disease status post posterior lumbar interbody fusion. 6. Stable L1 compression fracture without evidence of progressive height loss compared MRI 09/25/2014. 7. Ectatic infrarenal abdominal aorta with a maximal diameter of 2.9 cm. Ectatic abdominal aorta at risk for aneurysm development. Recommend followup by ultrasound in 5 years. This recommendation follows ACR consensus guidelines: White Paper of the ACR Incidental Findings Committee II on Vascular Findings. J Am Coll Radiol 2013; 10:789-794.   Electronically Signed   By: Jacqulynn Cadet M.D.   On: 10/10/2014 17:08     CBC  Recent Labs Lab 10/10/14 1245 10/11/14 0315  WBC 16.0* 9.7  HGB 13.9 12.0*  HCT 41.1 36.7*  PLT 361 303  MCV 88.6 88.9  MCH 30.0 29.1  MCHC 33.8 32.7  RDW 13.6 13.6  LYMPHSABS 0.8  --   MONOABS 0.9  --   EOSABS 0.0  --   BASOSABS 0.0  --     Chemistries   Recent Labs Lab 10/10/14 1245 10/11/14 0315  NA 137 139  K 3.8 3.1*  CL 97* 102  CO2 27 31  GLUCOSE 105* 94  BUN 14 10  CREATININE 0.94 0.70  CALCIUM 9.3 8.6*  AST 14* 11*  ALT 17 12*  ALKPHOS 102 83  BILITOT 0.8 0.6   ------------------------------------------------------------------------------------------------------------------ CrCl cannot be calculated (Unknown ideal  weight.). ------------------------------------------------------------------------------------------------------------------ No results for input(s): HGBA1C in the last 72 hours. ------------------------------------------------------------------------------------------------------------------ No results for input(s): CHOL, HDL, LDLCALC, TRIG, CHOLHDL, LDLDIRECT in the last 72 hours. ------------------------------------------------------------------------------------------------------------------ No results for input(s): TSH, T4TOTAL, T3FREE, THYROIDAB in the last 72 hours.  Invalid input(s): FREET3 ------------------------------------------------------------------------------------------------------------------ No results for input(s): VITAMINB12, FOLATE, FERRITIN, TIBC, IRON, RETICCTPCT in the last 72 hours.  Coagulation profile No results for input(s): INR, PROTIME in the last 168 hours.  No results for input(s): DDIMER in the last 72 hours.  Cardiac Enzymes No results for input(s): CKMB, TROPONINI, MYOGLOBIN in the last 168 hours.  Invalid input(s): CK ------------------------------------------------------------------------------------------------------------------ Invalid input(s): POCBNP   Time Spent in minutes  23   Jermaine Tholl K M.D on 10/11/2014 at 11:12 AM  Between 7am to 7pm - Pager - [phone]  After 7pm go to [email or web address] - password [facility]  [facility]   Office  [phone]

## 2014-10-12 LAB — BASIC METABOLIC PANEL
Anion gap: 7 (ref 5–15)
BUN: 7 mg/dL (ref 6–20)
CALCIUM: 8.7 mg/dL — AB (ref 8.9–10.3)
CHLORIDE: 103 mmol/L (ref 101–111)
CO2: 30 mmol/L (ref 22–32)
Creatinine, Ser: 0.94 mg/dL (ref 0.61–1.24)
GFR calc non Af Amer: >60 mL/min (ref >60)
GLUCOSE: 92 mg/dL (ref 65–99)
Potassium: 3.3 mmol/L — ABNORMAL LOW (ref 3.5–5.1)
Sodium: 140 mmol/L (ref 135–145)

## 2014-10-12 LAB — CBC
HEMATOCRIT: 36.9 % — AB (ref 39.0–52.0)
HEMOGLOBIN: 12.2 g/dL — AB (ref 13.0–17.0)
MCH: 29.5 pg (ref 26.0–34.0)
MCHC: 33.1 g/dL (ref 30.0–36.0)
MCV: 89.1 fL (ref 78.0–100.0)
Platelets: 301 10*3/uL (ref 150–400)
RBC: 4.14 MIL/uL — AB (ref 4.22–5.81)
RDW: 13.5 % (ref 11.5–15.5)
WBC: 7.8 10*3/uL (ref 4.0–10.5)

## 2014-10-12 LAB — MAGNESIUM: MAGNESIUM: 2.1 mg/dL (ref 1.7–2.4)

## 2014-10-12 MED ORDER — NICOTINE 21 MG/24HR TD PT24
21.0000 mg | MEDICATED_PATCH | Freq: Every day | TRANSDERMAL | Status: DC
  Administered 2014-10-12 – 2014-10-13 (×2): 21 mg via TRANSDERMAL
  Filled 2014-10-12 (×2): qty 1

## 2014-10-12 MED ORDER — POTASSIUM CHLORIDE CRYS ER 20 MEQ PO TBCR
40.0000 meq | EXTENDED_RELEASE_TABLET | Freq: Once | ORAL | Status: AC
  Administered 2014-10-12: 40 meq via ORAL
  Filled 2014-10-12: qty 2

## 2014-10-12 MED ORDER — POTASSIUM CHLORIDE CRYS ER 20 MEQ PO TBCR: 40.0000 meq | EXTENDED_RELEASE_TABLET | ORAL | Status: DC

## 2014-10-12 MED ORDER — BOOST / RESOURCE BREEZE PO LIQD
1.0000 | Freq: Three times a day (TID) | ORAL | Status: DC
  Administered 2014-10-12: 1 via ORAL

## 2014-10-12 MED ORDER — HYDROCODONE-ACETAMINOPHEN 5-325 MG PO TABS
1.0000 | ORAL_TABLET | ORAL | Status: DC | PRN
  Administered 2014-10-12 – 2014-10-13 (×2): 2 via ORAL
  Filled 2014-10-12 (×2): qty 2

## 2014-10-12 MED ORDER — DULOXETINE HCL 30 MG PO CPEP
30.0000 mg | ORAL_CAPSULE | Freq: Two times a day (BID) | ORAL | Status: DC
  Administered 2014-10-12 – 2014-10-13 (×3): 30 mg via ORAL
  Filled 2014-10-12 (×6): qty 1

## 2014-10-12 NOTE — Progress Notes (Signed)
Initial Nutrition Assessment  DOCUMENTATION CODES:  Not applicable  INTERVENTION:  Boost Breeze  NUTRITION DIAGNOSIS:  Inadequate oral intake related to altered GI function as evidenced by  (clear liquids, abdominal pain PTA).  GOAL:  Patient will meet greater than or equal to 90% of their needs  MONITOR:  PO intake, Supplement acceptance, Diet advancement, Labs, Weight trends, Skin, I & O's  REASON FOR ASSESSMENT:  Malnutrition Screening Tool    ASSESSMENT: Danny [name] is a 69 y.o. male with PMH significant for chronic pain syndrome. PTSD, CAD, HTN who presents complaining of abdominal pain that started 5 days prior to admission. Pain is more pronounce left lower quadrant, worse with meals. Has had decrease appetite. He denies nausea, vomiting, diarrhea. No chest pain or dyspnea.  Pt admitted with abdominal pain. Per surgical work-up, pt with acute diverticulitis microperforation and questionable mass.   Per surgical note, pt complains of being starving. He was just advanced to clear liquid diet this AM. Noted potential for d/c tomorrow if he tolerates solid food.   Pt sleeping soundly at time of visit. Unable to perform complete nutriton-focused physical exam at this time. Pt with thick pants on at time of visit.   Wt hx reveals wt fluctuations at baseline; difficult to assess degree of weight loss at this time.   Labs reviewed. K: 3.3.   Height:  Ht Readings from Last 1 Encounters:  10/11/14 6\' 5"  (1.956 m)    Weight:  Wt Readings from Last 1 Encounters:  10/11/14 179 lb (81.194 kg)    Ideal Body Weight:  94.5 kg  Wt Readings from Last 10 Encounters:  10/11/14 179 lb (81.194 kg)  02/24/13 195 lb 4 oz (88.565 kg)  09/25/11 172 lb 12.8 oz (78.382 kg)  02/18/11 197 lb (89.359 kg)  12/23/10 200 lb 12.8 oz (91.082 kg)  10/24/10 191 lb (86.637 kg)  10/23/10 191 lb 6.4 oz (86.818 kg)    BMI:  Body mass index is 21.22 kg/(m^2).  Estimated Nutritional  Needs:  Kcal:  2000-2200  Protein:  90-100 grams  Fluid:  2.0-2.2 L  Skin:  Reviewed, no issues  Diet Order:  Diet clear liquid Room service appropriate?: Yes; Fluid consistency:: Thin Diet full liquid Room service appropriate?: Yes; Fluid consistency:: Thin  EDUCATION NEEDS:  No education needs identified at this time   Intake/Output Summary (Last 24 hours) at 10/12/14 1115 Last data filed at 10/12/14 1013  Gross per 24 hour  Intake 3043.33 ml  Output   2150 ml  Net 893.33 ml    Last BM:  10/10/14  Danny [name] A. Jimmye Norman, RD, LDN, CDE Pager: [phone] After hours Pager: [phone]

## 2014-10-12 NOTE — Progress Notes (Signed)
Patient Demographics:    Danny [name], is a 69 y.o. male, DOB - 05-06-1945, PPI:[id number]  Admit date - 10/10/2014   Admitting Physician Danny Shiley, MD  Outpatient Primary MD for the patient is Danny [name], [place], DO  LOS - 2   Chief Complaint  Patient presents with  . Abdominal Pain        Subjective:    Danny [name] today has, No headache, No chest pain, no abdominal pain - No Nausea, No new weakness tingling or numbness, No Cough - SOB.     Assessment  & Plan :     1. Acute diverticulitis microperforation and questionable mass. Much improved with bowel rest, placed on clear liquids by surgery today, continue gentle IV fluids and IV anti-biotics. General surgery following. Once better will eventually require colonoscopy. Follows with Danny [name], had outpatient colonoscopy 1 year ago was unremarkable except for 2 polyps which were removed.   2. Hypokalemia replace IV & PO.   3. Chronic pain syndrome. Supportive care.   4. Peripheral neuropathy on Neurontin.    5.CAD - no acute issues.    Code Status : Full  Family Communication  : None present  Disposition Plan  : Home once better  Consults  :  Surgery  Procedures  : CT scan of abdomen and pelvis  DVT Prophylaxis  :  Lovenox    Lab Results  Component Value Date   PLT 303 10/11/2014    Inpatient Medications  Scheduled Meds: . ciprofloxacin  400 mg Intravenous Once  . ciprofloxacin  400 mg Intravenous Q12H  . diazepam  2 mg Oral BID  . enoxaparin (LOVENOX) injection  40 mg Subcutaneous Q24H  . fentaNYL  75 mcg Transdermal Q72H  . gabapentin  800 mg Oral BID  . metronidazole  500 mg Intravenous Q8H  . QUEtiapine  50 mg Oral QHS   Continuous Infusions: . dextrose 5 % with kcl 100 mL/hr at 10/12/14 0322   PRN  Meds:.acetaminophen **OR** acetaminophen, HYDROcodone-acetaminophen, morphine injection, ondansetron **OR** ondansetron (ZOFRAN) IV  Antibiotics  :     Anti-infectives    Start     Dose/Rate Route Frequency Ordered Stop   10/10/14 1815  ciprofloxacin (CIPRO) IVPB 400 mg     400 mg 200 mL/hr over 60 Minutes Intravenous Every 12 hours 10/10/14 1812     10/10/14 1815  metroNIDAZOLE (FLAGYL) IVPB 500 mg     500 mg 100 mL/hr over 60 Minutes Intravenous Every 8 hours 10/10/14 1813     10/10/14 1715  ciprofloxacin (CIPRO) IVPB 400 mg     400 mg 200 mL/hr over 60 Minutes Intravenous  Once 10/10/14 1712     10/10/14 1715  metroNIDAZOLE (FLAGYL) IVPB 500 mg     500 mg 100 mL/hr over 60 Minutes Intravenous  Once 10/10/14 1712 10/10/14 1834        Objective:   Filed Vitals:   10/11/14 1225 10/11/14 1407 10/11/14 2249 10/12/14 0609  BP:  126/62 112/55 102/46  Pulse:  76 68 66  Temp:  97.9 F (36.6 C) 98.3 F (36.8 C) 97.8 F (36.6 C)  TempSrc:  Oral Oral Oral  Resp:  18 17 16   Height: 6\' 5"  (1.956 m)  Weight: 81.194 kg (179 lb)     SpO2:  97% 97% 97%    Wt Readings from Last 3 Encounters:  10/11/14 81.194 kg (179 lb)  02/24/13 88.565 kg (195 lb 4 oz)  09/25/11 78.382 kg (172 lb 12.8 oz)     Intake/Output Summary (Last 24 hours) at 10/12/14 0856 Last data filed at 10/12/14 0626  Gross per 24 hour  Intake 3103.33 ml  Output   2150 ml  Net 953.33 ml     Physical Exam  Awake Alert, Oriented X 3, No new F.N deficits, Normal affect [name],PERRAL Supple Neck,No JVD, No cervical lymphadenopathy appriciated.  Symmetrical Chest wall movement, Good air movement bilaterally, CTAB RRR,No Gallops,Rubs or new Murmurs, No Parasternal Heave +ve B.Sounds, Abd Soft, No tenderness, No organomegaly appriciated, No rebound - guarding or rigidity. No Cyanosis, Clubbing or edema, No new Rash or bruise       Data Review:   Micro Results No results found for this or any previous visit  (from the past 240 hour(s)).  Radiology Reports Ct Abdomen Pelvis W Contrast  10/10/2014   CLINICAL DATA:  69 year old male with generalized abdominal pain.  EXAM: CT ABDOMEN AND PELVIS WITH CONTRAST  TECHNIQUE: Multidetector CT imaging of the abdomen and pelvis was performed using the standard protocol following bolus administration of intravenous contrast.  CONTRAST:  169mL OMNIPAQUE IOHEXOL 300 MG/ML  SOLN  COMPARISON:  Prior CT abdomen/ pelvis 03/02/2013  FINDINGS: Lower Chest: The lung bases are clear. Visualized cardiac structures are within normal limits for size. No pericardial effusion. Unremarkable visualized distal thoracic esophagus.  Abdomen: Scattered locules of free air are present throughout the upper abdomen in the perihepatic spaces, adjacent to the stomach, in the left subdiaphragmatic space and along the course of the omentum.  The stomach and duodenum appear intact without definitive wall thickening or evidence of perforation. There are 2 segments of sigmoid colon which are abnormally thickened including 1 in the deep anatomic pelvis position between the bladder and rectum, and a second more proximal segment in the left lower quadrant at the junction of the descending colon and sigmoid colon. Both regions of focal wall thickening are affiliated with inflammatory stranding in the adjacent pericolonic fat. No definite free air about either of these structures. The degree of inflammatory changes more prominent in the left lower quadrant at the junction of the descending and sigmoid colons. There may be trace free fluid within the left pericolic gutter. There is secondary reactive thickening of a loop of small bowel in the left lower quadrant adjacent to the inflamed segment of colon.  Unremarkable CT appearance of the spleen. The pancreas is largely fatty replaced. Low-attenuation nodularity both adrenal glands (more prominent on the right than the left) suggests adrenal hyperplasia or multiple  small adenomas. The appearance is unchanged dating back to November of 2014. Normal hepatic contour morphology. No discrete hepatic lesion. High attenuation material layers within the gallbladder lumen likely representing sludge and/or small stones. No intra or extrahepatic biliary ductal dilatation.  Stable cyst exophytic from the posterior aspect of the upper pole of the right kidney. Slight interval progression of nephrolithiasis in the lower pole of the right kidney. The largest lower pole stone and now measures up to 5 mm in diameter. There are at least 4 stones in the right lower pole. No definite stones on the left. No evidence of hydronephrosis. No enhancing renal mass.  Pelvis: Unremarkable bladder, prostate gland and seminal vesicles. No free fluid or  adenopathy.  Bones/Soft Tissues: No acute fracture or aggressive appearing lytic or blastic osseous lesion. Extensive multilevel degenerative disc disease. Surgical changes of prior L4-S1 posterior lumbar interbody fusion with additional lateral fusion and interbody graft at L3-L4. Compression fractures at T12 and L1 with changes of prior augmentation at T12. Comparing across modalities to the recent lumbar spine MRI dated 09/25/2014 there has been no significant progression of height loss of the L1 compression fracture.  Vascular: Ectatic infrarenal abdominal aorta with a maximal diameter of 2.9 cm. Scattered atherosclerotic vascular calcifications. No focal venous abnormality. Main portal and splanchnic veins are patent.  IMPRESSION: 1. CT imaging findings are most consistent with acute diverticulitis in the left lower quadrant at the junction of the descending and sigmoid colons. Additionally, there are multiple small locules of free air in the upper abdomen concerning for microperforation. No abscess collection. There is a trace volume of free fluid in the left paracolic gutter and secondary reactive inflammation of an adjacent loop of small bowel in the  left lower quadrant. 2. There is a second segment of abnormally thickened and inflamed sigmoid colon in the deep anatomic pelvis positioned between the bladder and rectum. This may represent a second site of diverticulitis, or a separate segmental infectious/inflammatory colitis. Alternately, a colonic neoplasm is difficult to exclude entirely at this location. However, the electronic medical record reveals a negative colonoscopy within the last 3 years. 3. High attenuation material consistent was sludge and/or small stones layers within the gallbladder lumen. 4. Slight progression of right lower pole nephrolithiasis. No evidence of hydronephrosis. 5. Multilevel degenerative disc disease status post posterior lumbar interbody fusion. 6. Stable L1 compression fracture without evidence of progressive height loss compared MRI 09/25/2014. 7. Ectatic infrarenal abdominal aorta with a maximal diameter of 2.9 cm. Ectatic abdominal aorta at risk for aneurysm development. Recommend followup by ultrasound in 5 years. This recommendation follows ACR consensus guidelines: White Paper of the ACR Incidental Findings Committee II on Vascular Findings. J Am Coll Radiol 2013; 10:789-794.   Electronically Signed   By: Jacqulynn Cadet M.D.   On: 10/10/2014 17:08     CBC  Recent Labs Lab 10/10/14 1245 10/11/14 0315  WBC 16.0* 9.7  HGB 13.9 12.0*  HCT 41.1 36.7*  PLT 361 303  MCV 88.6 88.9  MCH 30.0 29.1  MCHC 33.8 32.7  RDW 13.6 13.6  LYMPHSABS 0.8  --   MONOABS 0.9  --   EOSABS 0.0  --   BASOSABS 0.0  --     Chemistries   Recent Labs Lab 10/10/14 1245 10/11/14 0315 10/12/14 0341  NA 137 139 140  K 3.8 3.1* 3.3*  CL 97* 102 103  CO2 27 31 30   GLUCOSE 105* 94 92  BUN 14 10 7   CREATININE 0.94 0.70 0.94  CALCIUM 9.3 8.6* 8.7*  MG  --   --  2.1  AST 14* 11*  --   ALT 17 12*  --   ALKPHOS 102 83  --   BILITOT 0.8 0.6  --     ------------------------------------------------------------------------------------------------------------------ estimated creatinine clearance is 82.8 mL/min (by C-G formula based on Cr of 0.94). ------------------------------------------------------------------------------------------------------------------ No results for input(s): HGBA1C in the last 72 hours. ------------------------------------------------------------------------------------------------------------------ No results for input(s): CHOL, HDL, LDLCALC, TRIG, CHOLHDL, LDLDIRECT in the last 72 hours. ------------------------------------------------------------------------------------------------------------------ No results for input(s): TSH, T4TOTAL, T3FREE, THYROIDAB in the last 72 hours.  Invalid input(s): FREET3 ------------------------------------------------------------------------------------------------------------------ No results for input(s): VITAMINB12, FOLATE, FERRITIN, TIBC, IRON, RETICCTPCT in the last  72 hours.  Coagulation profile No results for input(s): INR, PROTIME in the last 168 hours.  No results for input(s): DDIMER in the last 72 hours.  Cardiac Enzymes No results for input(s): CKMB, TROPONINI, MYOGLOBIN in the last 168 hours.  Invalid input(s): CK ------------------------------------------------------------------------------------------------------------------ Invalid input(s): POCBNP   Time Spent in minutes   35   Kissy Cielo K M.D on 10/12/2014 at 8:56 AM  Between 7am to 7pm - Pager - [phone]  After 7pm go to [email or web address] - password [facility]  [facility]   Office  [phone]

## 2014-10-12 NOTE — Progress Notes (Signed)
[facility] Progress Note     Subjective: Pt feels much better, no N/V.  Ambulating well.  Pain resolved.  Wants to eat/drink, he's starving.  WBC normal yesterday.  No other complaints.  Urinating well, flatus, but no BM yet.  Objective: Vital signs in last 24 hours: Temp:  [97.8 F (36.6 C)-98.3 F (36.8 C)] 97.8 F (36.6 C) (07/01 0609) Pulse Rate:  [66-76] 66 (07/01 0609) Resp:  [16-18] 16 (07/01 0609) BP: (102-126)/(46-62) 102/46 mmHg (07/01 0609) SpO2:  [97 %] 97 % (07/01 0609) Weight:  [81.194 kg (179 lb)] 81.194 kg (179 lb) (06/30 1225) Last BM Date: 10/10/14  Intake/Output from previous day: 06/30 0701 - 07/01 0700 In: 3103.3 [P.O.:300; I.V.:1803.3; IV Piggyback:1000] Out: 2150 [Urine:2150] Intake/Output this shift:    PE: Gen:  Alert, NAD, pleasant Abd: Soft, NT/ND, +BS, no HSM   Lab Results:   Recent Labs  10/10/14 1245 10/11/14 0315  WBC 16.0* 9.7  HGB 13.9 12.0*  HCT 41.1 36.7*  PLT 361 303   BMET  Recent Labs  10/11/14 0315 10/12/14 0341  NA 139 140  K 3.1* 3.3*  CL 102 103  CO2 31 30  GLUCOSE 94 92  BUN 10 7  CREATININE 0.70 0.94  CALCIUM 8.6* 8.7*   PT/INR No results for input(s): LABPROT, INR in the last 72 hours. CMP     Component Value Date/Time   NA 140 10/12/2014 0341   K 3.3* 10/12/2014 0341   CL 103 10/12/2014 0341   CO2 30 10/12/2014 0341   GLUCOSE 92 10/12/2014 0341   BUN 7 10/12/2014 0341   CREATININE 0.94 10/12/2014 0341   CALCIUM 8.7* 10/12/2014 0341   PROT 5.8* 10/11/2014 0315   ALBUMIN 2.6* 10/11/2014 0315   AST 11* 10/11/2014 0315   ALT 12* 10/11/2014 0315   ALKPHOS 83 10/11/2014 0315   BILITOT 0.6 10/11/2014 0315   GFRNONAA >60 10/12/2014 0341   GFRAA >60 10/12/2014 0341   Lipase     Component Value Date/Time   LIPASE 14* 10/10/2014 1245       Studies/Results: Ct Abdomen Pelvis W Contrast  10/10/2014   CLINICAL DATA:  69 year old male with generalized abdominal pain.  EXAM: CT  ABDOMEN AND PELVIS WITH CONTRAST  TECHNIQUE: Multidetector CT imaging of the abdomen and pelvis was performed using the standard protocol following bolus administration of intravenous contrast.  CONTRAST:  164mL OMNIPAQUE IOHEXOL 300 MG/ML  SOLN  COMPARISON:  Prior CT abdomen/ pelvis 03/02/2013  FINDINGS: Lower Chest: The lung bases are clear. Visualized cardiac structures are within normal limits for size. No pericardial effusion. Unremarkable visualized distal thoracic esophagus.  Abdomen: Scattered locules of free air are present throughout the upper abdomen in the perihepatic spaces, adjacent to the stomach, in the left subdiaphragmatic space and along the course of the omentum.  The stomach and duodenum appear intact without definitive wall thickening or evidence of perforation. There are 2 segments of sigmoid colon which are abnormally thickened including 1 in the deep anatomic pelvis position between the bladder and rectum, and a second more proximal segment in the left lower quadrant at the junction of the descending colon and sigmoid colon. Both regions of focal wall thickening are affiliated with inflammatory stranding in the adjacent pericolonic fat. No definite free air about either of these structures. The degree of inflammatory changes more prominent in the left lower quadrant at the junction of the descending and sigmoid colons. There may be trace free fluid within the  left pericolic gutter. There is secondary reactive thickening of a loop of small bowel in the left lower quadrant adjacent to the inflamed segment of colon.  Unremarkable CT appearance of the spleen. The pancreas is largely fatty replaced. Low-attenuation nodularity both adrenal glands (more prominent on the right than the left) suggests adrenal hyperplasia or multiple small adenomas. The appearance is unchanged dating back to November of 2014. Normal hepatic contour morphology. No discrete hepatic lesion. High attenuation material  layers within the gallbladder lumen likely representing sludge and/or small stones. No intra or extrahepatic biliary ductal dilatation.  Stable cyst exophytic from the posterior aspect of the upper pole of the right kidney. Slight interval progression of nephrolithiasis in the lower pole of the right kidney. The largest lower pole stone and now measures up to 5 mm in diameter. There are at least 4 stones in the right lower pole. No definite stones on the left. No evidence of hydronephrosis. No enhancing renal mass.  Pelvis: Unremarkable bladder, prostate gland and seminal vesicles. No free fluid or adenopathy.  Bones/Soft Tissues: No acute fracture or aggressive appearing lytic or blastic osseous lesion. Extensive multilevel degenerative disc disease. Surgical changes of prior L4-S1 posterior lumbar interbody fusion with additional lateral fusion and interbody graft at L3-L4. Compression fractures at T12 and L1 with changes of prior augmentation at T12. Comparing across modalities to the recent lumbar spine MRI dated 09/25/2014 there has been no significant progression of height loss of the L1 compression fracture.  Vascular: Ectatic infrarenal abdominal aorta with a maximal diameter of 2.9 cm. Scattered atherosclerotic vascular calcifications. No focal venous abnormality. Main portal and splanchnic veins are patent.  IMPRESSION: 1. CT imaging findings are most consistent with acute diverticulitis in the left lower quadrant at the junction of the descending and sigmoid colons. Additionally, there are multiple small locules of free air in the upper abdomen concerning for microperforation. No abscess collection. There is a trace volume of free fluid in the left paracolic gutter and secondary reactive inflammation of an adjacent loop of small bowel in the left lower quadrant. 2. There is a second segment of abnormally thickened and inflamed sigmoid colon in the deep anatomic pelvis positioned between the bladder and  rectum. This may represent a second site of diverticulitis, or a separate segmental infectious/inflammatory colitis. Alternately, a colonic neoplasm is difficult to exclude entirely at this location. However, the electronic medical record reveals a negative colonoscopy within the last 3 years. 3. High attenuation material consistent was sludge and/or small stones layers within the gallbladder lumen. 4. Slight progression of right lower pole nephrolithiasis. No evidence of hydronephrosis. 5. Multilevel degenerative disc disease status post posterior lumbar interbody fusion. 6. Stable L1 compression fracture without evidence of progressive height loss compared MRI 09/25/2014. 7. Ectatic infrarenal abdominal aorta with a maximal diameter of 2.9 cm. Ectatic abdominal aorta at risk for aneurysm development. Recommend followup by ultrasound in 5 years. This recommendation follows ACR consensus guidelines: White Paper of the ACR Incidental Findings Committee II on Vascular Findings. J Am Coll Radiol 2013; 10:789-794.   Electronically Signed   By: Jacqulynn Cadet M.D.   On: 10/10/2014 17:08    Anti-infectives: Anti-infectives    Start     Dose/Rate Route Frequency Ordered Stop   10/10/14 1815  ciprofloxacin (CIPRO) IVPB 400 mg     400 mg 200 mL/hr over 60 Minutes Intravenous Every 12 hours 10/10/14 1812     10/10/14 1815  metroNIDAZOLE (FLAGYL) IVPB 500 mg  500 mg 100 mL/hr over 60 Minutes Intravenous Every 8 hours 10/10/14 1813     10/10/14 1715  ciprofloxacin (CIPRO) IVPB 400 mg     400 mg 200 mL/hr over 60 Minutes Intravenous  Once 10/10/14 1712     10/10/14 1715  metroNIDAZOLE (FLAGYL) IVPB 500 mg     500 mg 100 mL/hr over 60 Minutes Intravenous  Once 10/10/14 1712 10/10/14 1834       Assessment/Plan Acute diverticulitis with microperf -IV antibiotics (cipro/flagyl Day #2), IVF, pain control, antiemetics -Doesn't appear to have a drainable abscess -Start clears, advance to fulls at dinner  if tolerating -Ambulate and IS -SCD's and lovenox -Hopefully he will resolve with medical management and avoid surgical interventions -Appreciate medicines management of this patient -Hopefully d/c tomorrow if we can get on solid food -Follow up with Dr. Hulen Skains in 2-3 weeks    LOS: 2 days    Nat Christen 10/12/2014, 7:48 AM Pager: [phone]

## 2014-10-13 LAB — BASIC METABOLIC PANEL
Anion gap: 6 (ref 5–15)
BUN: 7 mg/dL (ref 6–20)
CO2: 30 mmol/L (ref 22–32)
CREATININE: 0.98 mg/dL (ref 0.61–1.24)
Calcium: 8.5 mg/dL — ABNORMAL LOW (ref 8.9–10.3)
Chloride: 101 mmol/L (ref 101–111)
GFR calc Af Amer: >60 mL/min (ref >60)
Glucose, Bld: 98 mg/dL (ref 65–99)
Potassium: 4.4 mmol/L (ref 3.5–5.1)
SODIUM: 137 mmol/L (ref 135–145)

## 2014-10-13 LAB — CBC
HCT: 34.8 % — ABNORMAL LOW (ref 39.0–52.0)
HEMOGLOBIN: 11.4 g/dL — AB (ref 13.0–17.0)
MCH: 29.1 pg (ref 26.0–34.0)
MCHC: 32.8 g/dL (ref 30.0–36.0)
MCV: 88.8 fL (ref 78.0–100.0)
Platelets: 307 10*3/uL (ref 150–400)
RBC: 3.92 MIL/uL — AB (ref 4.22–5.81)
RDW: 13.4 % (ref 11.5–15.5)
WBC: 6.5 10*3/uL (ref 4.0–10.5)

## 2014-10-13 LAB — MAGNESIUM: Magnesium: 2 mg/dL (ref 1.7–2.4)

## 2014-10-13 MED ORDER — CIPROFLOXACIN HCL 500 MG PO TABS: 500.0000 mg | ORAL_TABLET | Freq: Two times a day (BID) | ORAL | Status: DC

## 2014-10-13 MED ORDER — METRONIDAZOLE 500 MG PO TABS: 500.0000 mg | ORAL_TABLET | Freq: Three times a day (TID) | ORAL | Status: DC

## 2014-10-13 NOTE — Plan of Care (Signed)
Problem: Discharge Progression Outcomes Goal: Pain controlled with appropriate interventions Outcome: Completed/Met Date Met:  10/13/14 No c/o pain

## 2014-10-13 NOTE — Progress Notes (Signed)
Patient ID: Danny [name], male   DOB: 08/08/1945, 69 y.o.   MRN: [id number] [facility] Surgery Progress Note     Subjective: Pt has no pain.  Tolerating full liquids.    Objective: Vital signs in last 24 hours: Temp:  [97.5 F (36.4 C)-98 F (36.7 C)] 97.5 F (36.4 C) (07/02 0523) Pulse Rate:  [68-80] 68 (07/02 0633) Resp:  [16-18] 18 (07/02 5009) BP: (95-128)/(48-68) 128/68 mmHg (07/02 0633) SpO2:  [96 %-100 %] 100 % (07/02 0633) Last BM Date: 10/10/14  Intake/Output from previous day: 07/01 0701 - 07/02 0700 In: 2120 [P.O.:1320; I.V.:800] Out: 1700 [Urine:1700] Intake/Output this shift: Total I/O In: 360 [P.O.:360] Out: -   PE: Gen:  Alert, NAD, pleasant Abd: Soft, NT/ND  Lab Results:   Recent Labs  10/12/14 0838 10/13/14 0255  WBC 7.8 6.5  HGB 12.2* 11.4*  HCT 36.9* 34.8*  PLT 301 307   BMET  Recent Labs  10/12/14 0341 10/13/14 0255  NA 140 137  K 3.3* 4.4  CL 103 101  CO2 30 30  GLUCOSE 92 98  BUN 7 7  CREATININE 0.94 0.98  CALCIUM 8.7* 8.5*   PT/INR No results for input(s): LABPROT, INR in the last 72 hours. CMP     Component Value Date/Time   NA 137 10/13/2014 0255   K 4.4 10/13/2014 0255   CL 101 10/13/2014 0255   CO2 30 10/13/2014 0255   GLUCOSE 98 10/13/2014 0255   BUN 7 10/13/2014 0255   CREATININE 0.98 10/13/2014 0255   CALCIUM 8.5* 10/13/2014 0255   PROT 5.8* 10/11/2014 0315   ALBUMIN 2.6* 10/11/2014 0315   AST 11* 10/11/2014 0315   ALT 12* 10/11/2014 0315   ALKPHOS 83 10/11/2014 0315   BILITOT 0.6 10/11/2014 0315   GFRNONAA >60 10/13/2014 0255   GFRAA >60 10/13/2014 0255   Lipase     Component Value Date/Time   LIPASE 14* 10/10/2014 1245       Studies/Results: No results found.  Anti-infectives: Anti-infectives    Start     Dose/Rate Route Frequency Ordered Stop   10/13/14 0000  ciprofloxacin (CIPRO) 500 MG tablet     500 mg Oral 2 times daily 10/13/14 0811     10/13/14 0000  metroNIDAZOLE (FLAGYL)  500 MG tablet     500 mg Oral 3 times daily 10/13/14 0811     10/10/14 1815  ciprofloxacin (CIPRO) IVPB 400 mg     400 mg 200 mL/hr over 60 Minutes Intravenous Every 12 hours 10/10/14 1812     10/10/14 1815  metroNIDAZOLE (FLAGYL) IVPB 500 mg     500 mg 100 mL/hr over 60 Minutes Intravenous Every 8 hours 10/10/14 1813     10/10/14 1715  ciprofloxacin (CIPRO) IVPB 400 mg     400 mg 200 mL/hr over 60 Minutes Intravenous  Once 10/10/14 1712     10/10/14 1715  metroNIDAZOLE (FLAGYL) IVPB 500 mg     500 mg 100 mL/hr over 60 Minutes Intravenous  Once 10/10/14 1712 10/10/14 1834       Assessment/Plan Acute diverticulitis with microperf -IV antibiotics (cipro/flagyl Day #3), IVF, pain control, antiemetics -Doesn't appear to have a drainable abscess -soft diet for lunch.  Home today if tolerates lunch.   Antibiotics for total 2 week course.   -Ambulate and IS -SCD's and lovenox --Follow up with Dr. Hulen Skains in 2-3 weeks    LOS: 3 days    Rashema Seawright 10/13/2014, 10:38 AM

## 2014-10-13 NOTE — Plan of Care (Signed)
Problem: Phase II Progression Outcomes Goal: IV changed to normal saline lock Outcome: Completed/Met Date Met:  10/13/14 IV removed due to discharge order.

## 2014-10-13 NOTE — Discharge Instructions (Signed)
Follow with Primary MD BUTLER, CYNTHIA, DO in 7 days   Get CBC, CMP, 2 view Chest X ray checked  by Primary MD next visit.    Activity: As tolerated with Full fall precautions use walker/cane & assistance as needed   Disposition Home     Diet: Soft diet over the next 1 week  For Heart failure patients - Check your Weight same time everyday, if you gain over 2 pounds, or you develop in leg swelling, experience more shortness of breath or chest pain, call your Primary MD immediately. Follow Cardiac Low Salt Diet and 1.5 lit/day fluid restriction.   On your next visit with your primary care physician please Get Medicines reviewed and adjusted.   Please request your Prim.MD to go over all Hospital Tests and Procedure/Radiological results at the follow up, please get all Hospital records sent to your Prim MD by signing hospital release before you go home.   If you experience worsening of your admission symptoms, develop shortness of breath, life threatening emergency, suicidal or homicidal thoughts you must seek medical attention immediately by calling 911 or calling your MD immediately  if symptoms less severe.  You Must read complete instructions/literature along with all the possible adverse reactions/side effects for all the Medicines you take and that have been prescribed to you. Take any new Medicines after you have completely understood and accpet all the possible adverse reactions/side effects.   Do not drive, operating heavy machinery, perform activities at heights, swimming or participation in water activities or provide baby sitting services if your were admitted for syncope or siezures until you have seen by Primary MD or a Neurologist and advised to do so again.  Do not drive when taking Pain medications.    Do not take more than prescribed Pain, Sleep and Anxiety Medications  Special Instructions: If you have smoked or chewed Tobacco  in the last 2 yrs please stop smoking,  stop any regular Alcohol  and or any Recreational drug use.  Wear Seat belts while driving.   Please note  You were cared for by a hospitalist during your hospital stay. If you have any questions about your discharge medications or the care you received while you were in the hospital after you are discharged, you can call the unit and asked to speak with the hospitalist on call if the hospitalist that took care of you is not available. Once you are discharged, your primary care physician will handle any further medical issues. Please note that NO REFILLS for any discharge medications will be authorized once you are discharged, as it is imperative that you return to your primary care physician (or establish a relationship with a primary care physician if you do not have one) for your aftercare needs so that they can reassess your need for medications and monitor your lab values.
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
## 2014-10-13 NOTE — Progress Notes (Signed)
IV has been removed per order. Discharge instructions and prescriptions given and explained with teach back. Discharged via wheelchair to wife's care with NT present.

## 2014-10-25 ENCOUNTER — Other Ambulatory Visit: Payer: Self-pay | Admitting: Neurosurgery

## 2014-11-07 ENCOUNTER — Other Ambulatory Visit (HOSPITAL_COMMUNITY): Payer: Self-pay | Admitting: *Deleted

## 2014-11-07 ENCOUNTER — Encounter (HOSPITAL_COMMUNITY)
Admission: RE | Admit: 2014-11-07 | Discharge: 2014-11-07 | Disposition: A | Discharge status: Home / Self Care | Payer: Medicare Other | Source: Ambulatory Visit | Attending: Neurosurgery | Admitting: Neurosurgery

## 2014-11-07 ENCOUNTER — Encounter (HOSPITAL_COMMUNITY): Payer: Self-pay

## 2014-11-07 DIAGNOSIS — M4856XA Collapsed vertebra, not elsewhere classified, lumbar region, initial encounter for fracture: Secondary | ICD-10-CM | POA: Diagnosis not present

## 2014-11-07 DIAGNOSIS — Z01812 Encounter for preprocedural laboratory examination: Secondary | ICD-10-CM | POA: Diagnosis not present

## 2014-11-07 HISTORY — DX: Anxiety disorder, unspecified: F41.9

## 2014-11-07 HISTORY — DX: Cerebral infarction, unspecified: I63.9

## 2014-11-07 HISTORY — DX: Chronic obstructive pulmonary disease, unspecified: J44.9

## 2014-11-07 HISTORY — DX: Pneumonia, unspecified organism: J18.9

## 2014-11-07 LAB — SURGICAL PCR SCREEN
MRSA, PCR: NEGATIVE
STAPHYLOCOCCUS AUREUS: NEGATIVE

## 2014-11-07 LAB — BASIC METABOLIC PANEL
Anion gap: 8 (ref 5–15)
BUN: 13 mg/dL (ref 6–20)
CO2: 29 mmol/L (ref 22–32)
Calcium: 9 mg/dL (ref 8.9–10.3)
Chloride: 103 mmol/L (ref 101–111)
Creatinine, Ser: 0.92 mg/dL (ref 0.61–1.24)
GFR calc Af Amer: >60 mL/min (ref >60)
GFR calc non Af Amer: >60 mL/min (ref >60)
Glucose, Bld: 97 mg/dL (ref 65–99)
Potassium: 4.5 mmol/L (ref 3.5–5.1)
Sodium: 140 mmol/L (ref 135–145)

## 2014-11-07 LAB — CBC
HCT: 42.7 % (ref 39.0–52.0)
Hemoglobin: 13.9 g/dL (ref 13.0–17.0)
MCH: 29.9 pg (ref 26.0–34.0)
MCHC: 32.6 g/dL (ref 30.0–36.0)
MCV: 91.8 fL (ref 78.0–100.0)
PLATELETS: 161 10*3/uL (ref 150–400)
RBC: 4.65 MIL/uL (ref 4.22–5.81)
RDW: 15.1 % (ref 11.5–15.5)
WBC: 5.8 10*3/uL (ref 4.0–10.5)

## 2014-11-07 NOTE — Progress Notes (Signed)
Pt states he had a heart attack in September, 1999. States "they went in and cleaned out the blockages". The last time he saw a cardiologist was in early 2000. He states he's never had any more heart problems since, denies any recent chest pain. His PCP, Octavio Graves follows him for all his medical issues. Pt was in hospital here a month ago for abd pain and loss of appetite. Pt states that his appetite is still off and he has lost approx 15 lbs. Since mid June. Pt states he gets tired when he walks any distance, but denies sob. Color is pale. States he's done a home sleep study and was told it was positive, has an appt in August for an overnight study. Has not been given a cpap. States he doesn't sleep well. Has restless leg syndrome symptoms, never been offically diagnosed with Restless leg syndrome.

## 2014-11-07 NOTE — Pre-Procedure Instructions (Signed)
Danny [name]  11/07/2014      Your procedure is scheduled on Friday, November 16, 2014 at 2:20 PM.   Report to [facility] Entrance "A" Admitting Office at 11:15 AM.   Call this number if you have problems the morning of surgery: [phone]   Any questions prior to day of surgery, please call [phone] between 8 & 4 PM.   Remember:  Do not eat food or drink liquids after midnight Thursday, 11/15/14.  Take these medicines the morning of surgery with A SIP OF WATER: Duloxetine (Cymbalta), Gabapentin (Neurontin), Diazepam (Valium) - if needed, Hydrocodone - if needed  Stop Aspirin and Ibuprofen 7 days prior to surgery   Do not wear jewelry.  Do not wear lotions, powders, or cologne.  You may wear deodorant.  Men may shave face and neck.  Do not bring valuables to the hospital.  [facility] is not responsible for any belongings or valuables.  Contacts, dentures or bridgework may not be worn into surgery.  Leave your suitcase in the car.  After surgery it may be brought to your room.  For patients admitted to the hospital, discharge time will be determined by your treatment team.  Special instructions:  [facility] - Preparing for Surgery  Before surgery, you can play an important role.  Because skin is not sterile, your skin needs to be as free of germs as possible.  You can reduce the number of germs on you skin by washing with CHG (chlorahexidine gluconate) soap before surgery.  CHG is an antiseptic cleaner which kills germs and bonds with the skin to continue killing germs even after washing.  Please DO NOT use if you have an allergy to CHG or antibacterial soaps.  If your skin becomes reddened/irritated stop using the CHG and inform your nurse when you arrive at Short Stay.  Do not shave (including legs and underarms) for at least 48 hours prior to the first CHG shower.  You may shave your face.  Please follow these instructions carefully:   1.  Shower with CHG  Soap the night before surgery and the                                morning of Surgery.  2.  If you choose to wash your hair, wash your hair first as usual with your       normal shampoo.  3.  After you shampoo, rinse your hair and body thoroughly to remove the                      Shampoo.  4.  Use CHG as you would any other liquid soap.  You can apply chg directly       to the skin and wash gently with scrungie or a clean washcloth.  5.  Apply the CHG Soap to your body ONLY FROM THE NECK DOWN.        Do not use on open wounds or open sores.  Avoid contact with your eyes,  ears, mouth and genitals (private parts).  Wash genitals (private parts) with your normal soap.  6.  Wash thoroughly, paying special attention to the area where your surgery        will be performed.  7.  Thoroughly rinse your body with warm water from the neck down.  8.  DO NOT shower/wash with your normal  soap after using and rinsing off       the CHG Soap.  9.  Pat yourself dry with a clean towel.            10.  Wear clean pajamas.            11.  Place clean sheets on your bed the night of your first shower and do not        sleep with pets.  Day of Surgery  Do not apply any lotions the morning of surgery.  Please wear clean clothes to the hospital.    Please read over the following fact sheets that you were given. Pain Booklet, Coughing and Deep Breathing, MRSA Information and Surgical Site Infection Prevention

## 2014-11-08 NOTE — Progress Notes (Signed)
Anesthesia Chart Review:  Pt is 69 year old male scheduled for L1 kyphoplasty on 11/16/2014 with Dr. Vertell Limber.   PCP is Dr. Octavio Graves.   PMH includes: CAD (MI in 1999), stroke (by CT scan, didn't have symptoms), DVT, COPD, OSA (not on CPAP), hyperlipidemia, remote hx alcohol abuse. Current smoker. BMI 22.   Hospitalized 6/29-10/13/2014 for diverticulitis of large intestine with perforation without bleeding.   Medications include: ASA, valium, cymbalta, neurontin, seroquel.   Nuclear stress test 07/25/2007 (see correspondence in media tab dated 01/26/10) -Normal stress nuclear study, EF 63%.   Echo 07/25/2007: - Overall left ventricular systolic function was normal. Left ventricular ejection fraction was estimated, range being 60% to 65 %. There were no left ventricular regional wall motion abnormalities. Left ventricular wall thickness was mildly increased. - Aortic valve thickness was mildly increased. - There was mild aortic root dilatation. - The left atrium was mildly dilated.  Pt reported to PAT RN he had an MI in 1999 and "they went in and cleaned out the blockages". Denies seeing cardiology since 2000, but notes in [place] show pt saw Dr. Percival Spanish 07/12/2007 to evaluate pt for lower extremity edema and dyspnea. 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 The above echo and stress tests were performed after visit with Dr. Percival Spanish. I do not see that pt has been seen by cardiology since. Pt denies chest pain, subsequent cardiac problems at PAT.   Reviewed case with Dr. Al Corpus.   If no changes, I anticipate pt can proceed with surgery as scheduled.   Willeen Cass, FNP-BC [facility]/Anesthesiology Phone: [phone] 11/08/2014 5:11 PM

## 2014-11-13 NOTE — H&P (Signed)
Patient ID:   [id number] Patient: Danny [name]  Date of Birth: Nov 12, 1945 Visit Type: Office Visit   Date: 10/10/2014 10:30 AM Provider: Marchia Meiers. Vertell Limber MD   This 69 year old male presents for back pain.  History of Present Illness: 1.  back pain  Patient visits for review of MRI after falling early this month, suffering a compression fracture L1.  He has not worn his LSO, stating "I don't get out of bed".  Low back and right side abdomen pain persists.  He reports a very poor appetite, having no desire for food or coffee.  Wife states no intake 5 days.  Imaging on [place]  Patient is acute L1 compression fracture with previous kyphoplasty of T12 fracture.  He complains of severe back pain.  He is also complaining of abdominal pain.  His wife is extremely concerned about his usage of pain medications as he says he is on 75 g fentanyl patch and uses 180 hydrocodone per month and 2400 mg of gabapentin per month along with Cymbalta 70 mg daily and diazepam.  The patient notes significant discomfort into his abdomen.  Dr. Eben Burow is his GI doctor and they suggested that he follow-up with Dr. Deatra Ina for his abdominal pain.  His wife describes that he is a recluse and stays in his bedroom and doesn't come out much.  He describes that his back pain is severe and wants to get some relief.  He did well kyphoplasty in the past.      Medical/Surgical/Interim History Reviewed, no change.  Last detailed document date:06/19/2013.   PAST MEDICAL HISTORY, SURGICAL HISTORY, FAMILY HISTORY, SOCIAL HISTORY AND REVIEW OF SYSTEMS I have reviewed the patient's past medical, surgical, family and social history as well as the comprehensive review of systems as included on the [facility] history form dated 06/29/2013, which I have signed.  Family History: Reviewed, no changes.  Last detailed document: 06/19/2013.   Social History: Tobacco use reviewed. Reviewed, no  changes. Last detailed document date: 06/19/2013.      MEDICATIONS(added, continued or stopped this visit): Started Medication Directions Instruction Stopped   diazepam 10 mg tablet take 1 tablet by oral route 2- 3 times every day     fentanyl 100 mcg/hr transdermal patch apply 1 patch by transdermal route  every 72 hours     gabapentin 800 mg ORAL 800 mg twice daily     hydrocodone 7.5 mg-acetaminophen 325 mg tablet take 1 tablet by oral route  every 4 hours as needed for pain     ibuprofen 800 mg tablet take 1 tablet by oral route 3 times every day with food       ALLERGIES: Ingredient Reaction Medication Name Comment  NO KNOWN ALLERGIES     No known allergies.    Vitals Date Temp F BP Pulse Ht In Wt Lb BMI BSA Pain Score  10/10/2014  143/71 105 78 179 20.69  9/10      IMPRESSION Severe back pain after L1 fracture.  Patient has abdominal pain and is going to seek consultation with his gastroenterologist.  Completed Orders (this encounter) Order Details Reason Side Interpretation Result Initial Treatment Date Region  Hypertension education Continue to monitor blood pressure. If remains elevated, contact primary care physician.         Assessment/Plan # Detail Type Description   1. Assessment Closed compression fracture of L1 lumbar vertebral body (M48.56XA).       2. Assessment Low back pain,  unspecified back pain laterality, with sciatica presence unspecified (M54.5).       3. Assessment Elevated blood-pressure reading, w/o diagnosis of htn (R03.0).   Plan Orders Cane.         Pain Assessment/Treatment Pain Scale: 9/10. Method: Numeric Pain Intensity Scale. Location: back. Onset: 06/12/2002. Duration: varies. Quality: discomforting. Pain Assessment/Treatment follow-up plan of care: Patient is taking medications as prescribed..  Patient will follow up with me after clearance by gastroenterologist and if his pain continues to be severe we will proceed with L1  kyphoplasty.  Pain management options and dosages of medications need to be evaluated  Orders: Office Procedures/Services: Assessment Service Comments  R03.0 Cane    Diagnostic Procedures: Assessment Procedure  R03.0 Cane - Four-Pronged  Instruction(s)/Education: Assessment Instruction  R03.0 Hypertension education             Provider:  Marchia Meiers. Vertell Limber MD  10/12/2014 05:24 PM Dictation edited by: Marchia Meiers. Vertell Limber    CC Providers: [facility] [address] [place], [place] [address]-              Electronically signed by Marchia Meiers Vertell Limber MD on 10/12/2014 05:24 PM

## 2014-11-16 ENCOUNTER — Encounter (HOSPITAL_COMMUNITY): Payer: Self-pay | Admitting: *Deleted

## 2014-11-16 ENCOUNTER — Inpatient Hospital Stay (HOSPITAL_COMMUNITY): Payer: Medicare Other

## 2014-11-16 ENCOUNTER — Inpatient Hospital Stay (HOSPITAL_COMMUNITY)
Admission: RE | Admit: 2014-11-16 | Discharge: 2014-11-16 | DRG: 517 | Disposition: A | Discharge status: Home / Self Care | Payer: Medicare Other | Source: Ambulatory Visit | Attending: Neurosurgery | Admitting: Neurosurgery

## 2014-11-16 ENCOUNTER — Encounter (HOSPITAL_COMMUNITY)
Admission: RE | Disposition: A | Discharge status: Home / Self Care | Payer: Self-pay | Source: Ambulatory Visit | Attending: Neurosurgery

## 2014-11-16 ENCOUNTER — Ambulatory Visit (HOSPITAL_COMMUNITY): Payer: Medicare Other | Admitting: Anesthesiology

## 2014-11-16 ENCOUNTER — Ambulatory Visit (HOSPITAL_COMMUNITY): Payer: Medicare Other | Admitting: Emergency Medicine

## 2014-11-16 DIAGNOSIS — M4856XA Collapsed vertebra, not elsewhere classified, lumbar region, initial encounter for fracture: Secondary | ICD-10-CM | POA: Diagnosis present

## 2014-11-16 DIAGNOSIS — F17210 Nicotine dependence, cigarettes, uncomplicated: Secondary | ICD-10-CM | POA: Diagnosis present

## 2014-11-16 DIAGNOSIS — Z79891 Long term (current) use of opiate analgesic: Secondary | ICD-10-CM

## 2014-11-16 DIAGNOSIS — I2510 Atherosclerotic heart disease of native coronary artery without angina pectoris: Secondary | ICD-10-CM | POA: Diagnosis present

## 2014-11-16 DIAGNOSIS — M549 Dorsalgia, unspecified: Secondary | ICD-10-CM

## 2014-11-16 DIAGNOSIS — S32000A Wedge compression fracture of unspecified lumbar vertebra, initial encounter for closed fracture: Secondary | ICD-10-CM | POA: Diagnosis present

## 2014-11-16 DIAGNOSIS — J449 Chronic obstructive pulmonary disease, unspecified: Secondary | ICD-10-CM | POA: Diagnosis present

## 2014-11-16 DIAGNOSIS — G4733 Obstructive sleep apnea (adult) (pediatric): Secondary | ICD-10-CM | POA: Diagnosis present

## 2014-11-16 DIAGNOSIS — I252 Old myocardial infarction: Secondary | ICD-10-CM | POA: Diagnosis not present

## 2014-11-16 HISTORY — PX: KYPHOPLASTY: SHX5884

## 2014-11-16 SURGERY — KYPHOPLASTY
Anesthesia: General | Site: Back

## 2014-11-16 MED ORDER — LACTATED RINGERS IV SOLN
INTRAVENOUS | Status: DC
Start: 2014-11-16 — End: 2014-11-16
  Administered 2014-11-16: 50 mL/h via INTRAVENOUS

## 2014-11-16 MED ORDER — PROPOFOL 10 MG/ML IV BOLUS
INTRAVENOUS | Status: AC
  Filled 2014-11-16: qty 20

## 2014-11-16 MED ORDER — 0.9 % SODIUM CHLORIDE (POUR BTL) OPTIME
TOPICAL | Status: DC | PRN
  Administered 2014-11-16: 1000 mL

## 2014-11-16 MED ORDER — ONDANSETRON HCL 4 MG/2ML IJ SOLN: 4.0000 mg | INTRAMUSCULAR | Status: DC | PRN

## 2014-11-16 MED ORDER — SODIUM CHLORIDE 0.9 % IJ SOLN: 3.0000 mL | INTRAMUSCULAR | Status: DC | PRN

## 2014-11-16 MED ORDER — MENTHOL 3 MG MT LOZG: 1.0000 | LOZENGE | OROMUCOSAL | Status: DC | PRN

## 2014-11-16 MED ORDER — MIDAZOLAM HCL 5 MG/5ML IJ SOLN
INTRAMUSCULAR | Status: DC | PRN
  Administered 2014-11-16: 2 mg via INTRAVENOUS

## 2014-11-16 MED ORDER — SODIUM CHLORIDE 0.9 % IJ SOLN: 3.0000 mL | Freq: Two times a day (BID) | INTRAMUSCULAR | Status: DC

## 2014-11-16 MED ORDER — PHENOL 1.4 % MT LIQD: 1.0000 | OROMUCOSAL | Status: DC | PRN

## 2014-11-16 MED ORDER — MIDAZOLAM HCL 2 MG/2ML IJ SOLN
INTRAMUSCULAR | Status: AC
  Filled 2014-11-16: qty 4

## 2014-11-16 MED ORDER — METHOCARBAMOL 500 MG PO TABS: 500.0000 mg | ORAL_TABLET | Freq: Four times a day (QID) | ORAL | Status: DC | PRN

## 2014-11-16 MED ORDER — HYDROMORPHONE HCL 1 MG/ML IJ SOLN: 0.5000 mg | INTRAMUSCULAR | Status: DC | PRN

## 2014-11-16 MED ORDER — FENTANYL CITRATE (PF) 100 MCG/2ML IJ SOLN
25.0000 ug | INTRAMUSCULAR | Status: DC | PRN
  Administered 2014-11-16: 50 ug via INTRAVENOUS

## 2014-11-16 MED ORDER — POLYETHYLENE GLYCOL 3350 17 G PO PACK: 17.0000 g | PACK | Freq: Every day | ORAL | Status: DC | PRN

## 2014-11-16 MED ORDER — GABAPENTIN 800 MG PO TABS: 800.0000 mg | ORAL_TABLET | Freq: Two times a day (BID) | ORAL | Status: DC

## 2014-11-16 MED ORDER — DIAZEPAM 5 MG PO TABS
ORAL_TABLET | ORAL | Status: AC
  Administered 2014-11-16: 5 mg via ORAL
  Filled 2014-11-16: qty 1

## 2014-11-16 MED ORDER — CEFAZOLIN SODIUM-DEXTROSE 2-3 GM-% IV SOLR
INTRAVENOUS | Status: AC
  Administered 2014-11-16: 2 g via INTRAVENOUS
  Filled 2014-11-16: qty 50

## 2014-11-16 MED ORDER — LIDOCAINE-EPINEPHRINE 1 %-1:100000 IJ SOLN
INTRAMUSCULAR | Status: DC | PRN
  Administered 2014-11-16: 5 mL

## 2014-11-16 MED ORDER — OXYCODONE HCL 5 MG PO TABS
5.0000 mg | ORAL_TABLET | Freq: Once | ORAL | Status: AC | PRN
  Administered 2014-11-16: 5 mg via ORAL

## 2014-11-16 MED ORDER — HYDROCODONE-ACETAMINOPHEN 10-325 MG PO TABS: 1.0000 | ORAL_TABLET | Freq: Four times a day (QID) | ORAL | Status: DC | PRN

## 2014-11-16 MED ORDER — SODIUM CHLORIDE 0.9 % IV SOLN: 250.0000 mL | INTRAVENOUS | Status: DC

## 2014-11-16 MED ORDER — PROPOFOL 10 MG/ML IV BOLUS
INTRAVENOUS | Status: DC | PRN
  Administered 2014-11-16: 20 mg via INTRAVENOUS
  Administered 2014-11-16: 150 mg via INTRAVENOUS

## 2014-11-16 MED ORDER — OXYCODONE HCL 5 MG/5ML PO SOLN: 5.0000 mg | Freq: Once | ORAL | Status: AC | PRN

## 2014-11-16 MED ORDER — PROMETHAZINE HCL 25 MG PO TABS: 25.0000 mg | ORAL_TABLET | Freq: Two times a day (BID) | ORAL | Status: DC | PRN

## 2014-11-16 MED ORDER — DIAZEPAM 5 MG PO TABS
10.0000 mg | ORAL_TABLET | Freq: Two times a day (BID) | ORAL | Status: DC | PRN
  Administered 2014-11-16: 5 mg via ORAL
  Filled 2014-11-16: qty 2

## 2014-11-16 MED ORDER — ONDANSETRON HCL 4 MG/2ML IJ SOLN: 4.0000 mg | Freq: Four times a day (QID) | INTRAMUSCULAR | Status: DC | PRN

## 2014-11-16 MED ORDER — BISACODYL 10 MG RE SUPP: 10.0000 mg | Freq: Every day | RECTAL | Status: DC | PRN

## 2014-11-16 MED ORDER — LIDOCAINE HCL (CARDIAC) 20 MG/ML IV SOLN
INTRAVENOUS | Status: DC | PRN
  Administered 2014-11-16: 100 mg via INTRAVENOUS

## 2014-11-16 MED ORDER — PANTOPRAZOLE SODIUM 40 MG IV SOLR: 40.0000 mg | Freq: Every day | INTRAVENOUS | Status: DC

## 2014-11-16 MED ORDER — DOCUSATE SODIUM 100 MG PO CAPS: 100.0000 mg | ORAL_CAPSULE | Freq: Two times a day (BID) | ORAL | Status: DC

## 2014-11-16 MED ORDER — CEFAZOLIN SODIUM-DEXTROSE 2-3 GM-% IV SOLR: 2.0000 g | INTRAVENOUS | Status: DC

## 2014-11-16 MED ORDER — ACETAMINOPHEN 325 MG PO TABS: 650.0000 mg | ORAL_TABLET | ORAL | Status: DC | PRN

## 2014-11-16 MED ORDER — FLEET ENEMA 7-19 GM/118ML RE ENEM: 1.0000 | ENEMA | Freq: Once | RECTAL | Status: DC | PRN

## 2014-11-16 MED ORDER — ALUM & MAG HYDROXIDE-SIMETH 200-200-20 MG/5ML PO SUSP: 30.0000 mL | Freq: Four times a day (QID) | ORAL | Status: DC | PRN

## 2014-11-16 MED ORDER — IBUPROFEN 200 MG PO TABS: 800.0000 mg | ORAL_TABLET | Freq: Three times a day (TID) | ORAL | Status: DC | PRN

## 2014-11-16 MED ORDER — QUETIAPINE FUMARATE 50 MG PO TABS: 50.0000 mg | ORAL_TABLET | Freq: Every day | ORAL | Status: DC

## 2014-11-16 MED ORDER — DULOXETINE HCL 30 MG PO CPEP: 30.0000 mg | ORAL_CAPSULE | Freq: Two times a day (BID) | ORAL | Status: DC

## 2014-11-16 MED ORDER — FENTANYL CITRATE (PF) 100 MCG/2ML IJ SOLN
INTRAMUSCULAR | Status: AC
  Filled 2014-11-16: qty 2

## 2014-11-16 MED ORDER — ACETAMINOPHEN 650 MG RE SUPP: 650.0000 mg | RECTAL | Status: DC | PRN

## 2014-11-16 MED ORDER — KCL IN DEXTROSE-NACL 20-5-0.45 MEQ/L-%-% IV SOLN: INTRAVENOUS | Status: DC

## 2014-11-16 MED ORDER — HYDROCODONE-ACETAMINOPHEN 5-325 MG PO TABS: 1.0000 | ORAL_TABLET | ORAL | Status: DC | PRN

## 2014-11-16 MED ORDER — NITROGLYCERIN 0.4 MG SL SUBL: 0.4000 mg | SUBLINGUAL_TABLET | SUBLINGUAL | Status: DC | PRN

## 2014-11-16 MED ORDER — CEFAZOLIN SODIUM 1-5 GM-% IV SOLN: 1.0000 g | Freq: Three times a day (TID) | INTRAVENOUS | Status: DC

## 2014-11-16 MED ORDER — BUPIVACAINE HCL (PF) 0.25 % IJ SOLN
INTRAMUSCULAR | Status: DC | PRN
  Administered 2014-11-16: 5 mL

## 2014-11-16 MED ORDER — SUCCINYLCHOLINE CHLORIDE 20 MG/ML IJ SOLN
INTRAMUSCULAR | Status: DC | PRN
  Administered 2014-11-16: 100 mg via INTRAVENOUS

## 2014-11-16 MED ORDER — OXYCODONE HCL 5 MG PO TABS
ORAL_TABLET | ORAL | Status: AC
  Filled 2014-11-16: qty 1

## 2014-11-16 MED ORDER — FENTANYL 25 MCG/HR TD PT72: 75.0000 ug | MEDICATED_PATCH | TRANSDERMAL | Status: DC

## 2014-11-16 MED ORDER — ASPIRIN EC 325 MG PO TBEC: 325.0000 mg | DELAYED_RELEASE_TABLET | Freq: Every day | ORAL | Status: DC

## 2014-11-16 MED ORDER — LIDOCAINE HCL (CARDIAC) 20 MG/ML IV SOLN
INTRAVENOUS | Status: AC
  Filled 2014-11-16: qty 5

## 2014-11-16 MED ORDER — IOHEXOL 300 MG/ML  SOLN
INTRAMUSCULAR | Status: DC | PRN
  Administered 2014-11-16: 100 mL via INTRAVENOUS

## 2014-11-16 MED ORDER — FENTANYL CITRATE (PF) 250 MCG/5ML IJ SOLN
INTRAMUSCULAR | Status: AC
  Filled 2014-11-16: qty 5

## 2014-11-16 MED ORDER — DEXTROSE 5 % IV SOLN
500.0000 mg | Freq: Four times a day (QID) | INTRAVENOUS | Status: DC | PRN
  Filled 2014-11-16: qty 5

## 2014-11-16 MED ORDER — OXYCODONE-ACETAMINOPHEN 5-325 MG PO TABS
1.0000 | ORAL_TABLET | ORAL | Status: DC | PRN
  Administered 2014-11-16: 2 via ORAL
  Filled 2014-11-16: qty 2

## 2014-11-16 MED ORDER — FENTANYL CITRATE (PF) 100 MCG/2ML IJ SOLN
INTRAMUSCULAR | Status: DC | PRN
  Administered 2014-11-16: 50 ug via INTRAVENOUS
  Administered 2014-11-16: 100 ug via INTRAVENOUS

## 2014-11-16 SURGICAL SUPPLY — 44 items
ADH SKN CLS APL DERMABOND .7 (GAUZE/BANDAGES/DRESSINGS) ×1
ADH SKN CLS LQ APL DERMABOND (GAUZE/BANDAGES/DRESSINGS) ×1
BLADE CLIPPER SURG (BLADE) IMPLANT
BLADE SURG 15 STRL LF DISP TIS (BLADE) ×1 IMPLANT
BLADE SURG 15 STRL SS (BLADE) ×3
CEMENT BONE KYPHON CDS (Cement) ×2 IMPLANT
DECANTER SPIKE VIAL GLASS SM (MISCELLANEOUS) ×3 IMPLANT
DERMABOND ADHESIVE PROPEN (GAUZE/BANDAGES/DRESSINGS) ×2
DERMABOND ADVANCED (GAUZE/BANDAGES/DRESSINGS) ×2
DERMABOND ADVANCED .7 DNX12 (GAUZE/BANDAGES/DRESSINGS) ×1 IMPLANT
DERMABOND ADVANCED .7 DNX6 (GAUZE/BANDAGES/DRESSINGS) IMPLANT
DRAPE C-ARM 42X72 X-RAY (DRAPES) ×3 IMPLANT
DRAPE LAPAROTOMY 100X72X124 (DRAPES) ×3 IMPLANT
DRAPE PROXIMA HALF (DRAPES) ×3 IMPLANT
DRAPE SURG 17X23 STRL (DRAPES) ×3 IMPLANT
DURAPREP 26ML APPLICATOR (WOUND CARE) ×3 IMPLANT
GAUZE SPONGE 4X4 16PLY XRAY LF (GAUZE/BANDAGES/DRESSINGS) ×3 IMPLANT
GLOVE BIO SURGEON STRL SZ8 (GLOVE) ×3 IMPLANT
GLOVE BIOGEL PI IND STRL 8.5 (GLOVE) ×1 IMPLANT
GLOVE BIOGEL PI INDICATOR 8.5 (GLOVE) ×2
GLOVE EXAM NITRILE LRG STRL (GLOVE) IMPLANT
GLOVE EXAM NITRILE MD LF STRL (GLOVE) IMPLANT
GLOVE EXAM NITRILE XL STR (GLOVE) IMPLANT
GLOVE EXAM NITRILE XS STR PU (GLOVE) IMPLANT
GOWN STRL REUS W/ TWL LRG LVL3 (GOWN DISPOSABLE) IMPLANT
GOWN STRL REUS W/ TWL XL LVL3 (GOWN DISPOSABLE) IMPLANT
GOWN STRL REUS W/TWL 2XL LVL3 (GOWN DISPOSABLE) IMPLANT
GOWN STRL REUS W/TWL LRG LVL3 (GOWN DISPOSABLE)
GOWN STRL REUS W/TWL XL LVL3 (GOWN DISPOSABLE)
KIT BASIN OR (CUSTOM PROCEDURE TRAY) ×3 IMPLANT
KIT ROOM TURNOVER OR (KITS) ×3 IMPLANT
MIXER KYPHON (MISCELLANEOUS) ×2 IMPLANT
NDL HYPO 25X1 1.5 SAFETY (NEEDLE) ×1 IMPLANT
NEEDLE HYPO 25X1 1.5 SAFETY (NEEDLE) ×3 IMPLANT
NS IRRIG 1000ML POUR BTL (IV SOLUTION) ×3 IMPLANT
PACK SURGICAL SETUP 50X90 (CUSTOM PROCEDURE TRAY) ×3 IMPLANT
PAD ARMBOARD 7.5X6 YLW CONV (MISCELLANEOUS) ×9 IMPLANT
STAPLER SKIN PROX WIDE 3.9 (STAPLE) ×3 IMPLANT
SUT VIC AB 3-0 SH 8-18 (SUTURE) ×3 IMPLANT
SYR CONTROL 10ML LL (SYRINGE) ×6 IMPLANT
TOWEL OR 17X24 6PK STRL BLUE (TOWEL DISPOSABLE) ×3 IMPLANT
TOWEL OR 17X26 10 PK STRL BLUE (TOWEL DISPOSABLE) ×3 IMPLANT
TRAY KYPHOPAK 20/3 ONESTEP 1ST (MISCELLANEOUS) IMPLANT
TRAY KYPHOPAK 20/3 ONESTEP CDS (KITS) ×2 IMPLANT

## 2014-11-16 NOTE — OR Nursing (Signed)
Dentures upper and lower returned to the patient with his glasses.

## 2014-11-16 NOTE — Anesthesia Preprocedure Evaluation (Signed)
Anesthesia Evaluation  Patient identified by MRN, date of birth, ID band Patient awake    Reviewed: Allergy & Precautions, NPO status , Patient's Chart, lab work & pertinent test results  Airway Mallampati: I  TM Distance: >3 FB Neck ROM: Full    Dental  (+) Edentulous Upper, Edentulous Lower, Upper Dentures, Lower Dentures, Dental Advisory Given   Pulmonary sleep apnea , COPDCurrent Smoker,  Diagnosis of COPD, but no inhalers Recent diagnosis of OSA, but not on CPAP breath sounds clear to auscultation  Pulmonary exam normal       Cardiovascular - angina+ CAD and + Past MI Normal cardiovascular examRhythm:Regular Rate:Normal  MI s/p PCI with 3 vessel balloon angioplasty per patient   Neuro/Psych  Headaches, Anxiety Depression PTSD Nueropathy TIA vs CVA per patient. Never had stroke symptoms. TIA   GI/Hepatic negative GI ROS, Neg liver ROS,   Endo/Other  negative endocrine ROS  Renal/GU negative Renal ROS     Musculoskeletal  (+) Arthritis -, Osteoarthritis,    Abdominal Normal abdominal exam  (+)   Peds  Hematology negative hematology ROS (+)   Anesthesia Other Findings Day of surgery medications reviewed with the patient.  Tobacco abuse--1PPD  Reproductive/Obstetrics                             Anesthesia Physical Anesthesia Plan  ASA: III  Anesthesia Plan: General   Post-op Pain Management:    Induction: Intravenous  Airway Management Planned: Oral ETT  Additional Equipment:   Intra-op Plan:   Post-operative Plan: Extubation in OR  Informed Consent: I have reviewed the patients History and Physical, chart, labs and discussed the procedure including the risks, benefits and alternatives for the proposed anesthesia with the patient or authorized representative who has indicated his/her understanding and acceptance.   Dental advisory given  Plan Discussed with:  CRNA  Anesthesia Plan Comments: (Risks/benefits of general anesthesia discussed with patient including risk of damage to teeth, lips, gum, and tongue, nausea/vomiting, allergic reactions to medications, and the possibility of heart attack, stroke and death.  All patient questions answered.  Patient wishes to proceed.)        Anesthesia Quick Evaluation

## 2014-11-16 NOTE — Interval H&P Note (Signed)
History and Physical Interval Note:  11/16/2014 12:36 PM  Danny [name]  has presented today for surgery, with the diagnosis of L1 Compression fracture  The various methods of treatment have been discussed with the patient and family. After consideration of risks, benefits and other options for treatment, the patient has consented to  Procedure(s) with comments: L1 Kyphoplasty (N/A) - L1 Kyphoplasty as a surgical intervention .  The patient's history has been reviewed, patient examined, no change in status, stable for surgery.  I have reviewed the patient's chart and labs.  Questions were answered to the patient's satisfaction.     [name]

## 2014-11-16 NOTE — Progress Notes (Signed)
Pt and wife given D/C instructions with verbal understanding. Pt is doing well. Pt's incision is clean and dry with no sign of infection. Pt's IV was removed prior to D/C. Pt D/C'd home via wheelchair @ 1835 per MD order. Pt is stable @ D/C and has no other needs at this time. Holli Humbles, RN

## 2014-11-16 NOTE — Anesthesia Postprocedure Evaluation (Signed)
Anesthesia Post Note  Patient: Danny [name]  Procedure(s) Performed: Procedure(s) (LRB): L1 Kyphoplasty (N/A)  Anesthesia type: General  Patient location: PACU  Post pain: Pain level controlled and Adequate analgesia  Post assessment: Post-op Vital signs reviewed, Patient's Cardiovascular Status Stable, Respiratory Function Stable, Patent Airway and Pain level controlled  Last Vitals:  Filed Vitals:   11/16/14 1630  BP: 142/64  Pulse: 59  Temp:   Resp: 11    Post vital signs: Reviewed and stable  Level of consciousness: awake, alert  and oriented  Complications: No apparent anesthesia complications

## 2014-11-16 NOTE — Progress Notes (Signed)
Physician Discharge Summary  Patient ID: Danny [name] MRN: [id number] DOB/AGE: 1945/08/18 69 y.o.  Admit date: 11/16/2014 Discharge date: 11/16/2014  Admission Diagnoses:L1 compression fracture  Discharge Diagnoses: Same Active Problems:   Lumbar compression fracture   Discharged Condition: good  Hospital Course: Uncomplicated L 1 kyphoplasty procedure  Consults: None  Significant Diagnostic Studies: None  Treatments: surgery: L 1 kyphoplasty procedure   Discharge Exam: Blood pressure 138/67, pulse 63, temperature 97.6 F (36.4 C), temperature source Oral, resp. rate 14, height 6\' 4"  (1.93 m), weight 82.101 kg (181 lb), SpO2 100 %. Neurologic: Alert and oriented X 3, normal strength and tone. Normal symmetric reflexes. Normal coordination and gait Wound:CDI  Disposition: Home     Medication List    ASK your doctor about these medications        aspirin EC 325 MG tablet  Take 325 mg by mouth daily.     diazepam 10 MG tablet  Commonly known as:  VALIUM  Take 10 mg by mouth every 12 (twelve) hours as needed.     DULoxetine 30 MG capsule  Commonly known as:  CYMBALTA  Take 30 mg by mouth 2 (two) times daily.     fentaNYL 75 MCG/HR  Commonly known as:  DURAGESIC - dosed mcg/hr  Place 75 mcg onto the skin every 3 (three) days.     gabapentin 800 MG tablet  Commonly known as:  NEURONTIN  Take 800 mg by mouth 2 (two) times daily.     HYDROcodone-acetaminophen 10-325 MG per tablet  Commonly known as:  NORCO  Take 1 tablet by mouth every 6 (six) hours as needed.     ibuprofen 800 MG tablet  Commonly known as:  ADVIL,MOTRIN  Take 800 mg by mouth every 8 (eight) hours as needed for fever, headache or moderate pain.     nitroGLYCERIN 0.4 MG SL tablet  Commonly known as:  NITROSTAT  Place 0.4 mg under the tongue every 5 (five) minutes x 3 doses as needed for chest pain.     promethazine 25 MG tablet  Commonly known as:  PHENERGAN  Take 25-50 mg by mouth 2  (two) times daily as needed for nausea or vomiting. For pain.     QUEtiapine 50 MG tablet  Commonly known as:  SEROQUEL  Take 50 mg by mouth at bedtime.         Signed: Peggyann Shoals, MD 11/16/2014, 4:13 PM

## 2014-11-16 NOTE — Op Note (Signed)
11/16/2014  3:29 PM  PATIENT:  Danny [name]  69 y.o. male  PRE-OPERATIVE DIAGNOSIS:  L1 Compression fracture  POST-OPERATIVE DIAGNOSIS:  L1 Compression fracture  PROCEDURE:  Procedure(s) with comments: L1 Kyphoplasty (N/A) - L1 Kyphoplasty  SURGEON:  Surgeon(s) and Role:    * Erline Levine, MD - Primary  PHYSICIAN ASSISTANT:   ASSISTANTS: none   ANESTHESIA:   general  EBL:     BLOOD ADMINISTERED:none  DRAINS: none   LOCAL MEDICATIONS USED:  LIDOCAINE   SPECIMEN:  No Specimen  DISPOSITION OF SPECIMEN:  N/A  COUNTS:  YES  TOURNIQUET:  * No tourniquets in log *  DICTATION: DICTATION: Patient is 69 year old man with back pain.  He previously had a T 12 compression fracture and has now developed a painful L 1 compression fracture, which is proving debilitatingly painful to him.  It was elected to take him to surgery for kyphoplasty procedure.  PROCEDURE:  Following the smooth and uncomplicated induction of general endotracheal anesthesia, the patient was placed in a prone position on chest rolls.  C-arm fluoroscopy was positioned in both the AP and lateral planes, centered on the L 1 vertebra.  His back was prepped and draped in the usual sterile fashion with betadine scrub and Duraprep.  Using a bipedicular approach, initially the right and subsequently the left pedicle  and vertebral body were entered with the trochar using standard landmarks.  The drill was used, followed by  20 cc Kyphon balloons, which were used to re-expand the broken vertebra.  Subsequently, 12 cc of bone cement was placed into the void created by the balloon and was seen to fill the fracture cleft and fill the vertebra in both the AP and lateral direction with good interdigitation and with minimal apparent extravasation within the inferior disc space. The bone void fillers were then removed.  Final X-ray demonstrated good filling within the fractured vertebra.  The incisions were closed with  single 3-0  vicryl stitches and dressed with Dermabond. The patient was returned to the [facility] and extubated in the OR and taken to Recovery in stable and satisfactory condition, having tolerated the procedure well.  Counts were correct at the end of the case.   PLAN OF CARE: Admit for overnight observation  PATIENT DISPOSITION:  PACU - hemodynamically stable.   Delay start of Pharmacological VTE agent (>24hrs) due to surgical blood loss or risk of bleeding: yes

## 2014-11-16 NOTE — Progress Notes (Signed)
Awake, alert, conversant.  MAEW with good power.  Doing well.  

## 2014-11-16 NOTE — Brief Op Note (Signed)
11/16/2014  3:29 PM  PATIENT:  Danny [name]  69 y.o. male  PRE-OPERATIVE DIAGNOSIS:  L1 Compression fracture  POST-OPERATIVE DIAGNOSIS:  L1 Compression fracture  PROCEDURE:  Procedure(s) with comments: L1 Kyphoplasty (N/A) - L1 Kyphoplasty  SURGEON:  Surgeon(s) and Role:    * Erline Levine, MD - Primary  PHYSICIAN ASSISTANT:   ASSISTANTS: none   ANESTHESIA:   general  EBL:     BLOOD ADMINISTERED:none  DRAINS: none   LOCAL MEDICATIONS USED:  LIDOCAINE   SPECIMEN:  No Specimen  DISPOSITION OF SPECIMEN:  N/A  COUNTS:  YES  TOURNIQUET:  * No tourniquets in log *  DICTATION: DICTATION: Patient is 69 year old man with back pain.  He previously had a T 12 compression fracture and has now developed a painful L 1 compression fracture, which is proving debilitatingly painful to him.  It was elected to take him to surgery for kyphoplasty procedure.  PROCEDURE:  Following the smooth and uncomplicated induction of general endotracheal anesthesia, the patient was placed in a prone position on chest rolls.  C-arm fluoroscopy was positioned in both the AP and lateral planes, centered on the L 1 vertebra.  His back was prepped and draped in the usual sterile fashion with betadine scrub and Duraprep.  Using a bipedicular approach, initially the right and subsequently the left pedicle  and vertebral body were entered with the trochar using standard landmarks.  The drill was used, followed by  20 cc Kyphon balloons, which were used to re-expand the broken vertebra.  Subsequently, 12 cc of bone cement was placed into the void created by the balloon and was seen to fill the fracture cleft and fill the vertebra in both the AP and lateral direction with good interdigitation and with minimal apparent extravasation within the inferior disc space. The bone void fillers were then removed.  Final X-ray demonstrated good filling within the fractured vertebra.  The incisions were closed with  single 3-0  vicryl stitches and dressed with Dermabond. The patient was returned to the [place] and extubated in the OR and taken to Recovery in stable and satisfactory condition, having tolerated the procedure well.  Counts were correct at the end of the case.   PLAN OF CARE: Admit for overnight observation  PATIENT DISPOSITION:  PACU - hemodynamically stable.   Delay start of Pharmacological VTE agent (>24hrs) due to surgical blood loss or risk of bleeding: yes

## 2014-11-17 NOTE — Anesthesia Procedure Notes (Signed)
Procedure Name: Intubation Date/Time: 11/16/2014 2:19 PM Performed by: Melina Copa, Shakenya Stoneberg R Pre-anesthesia Checklist: Patient identified, Emergency Drugs available, Suction available, Patient being monitored and Timeout performed Patient Re-evaluated:Patient Re-evaluated prior to inductionOxygen Delivery Method: Circle system utilized Preoxygenation: Pre-oxygenation with 100% oxygen Intubation Type: IV induction Ventilation: Mask ventilation without difficulty Laryngoscope Size: Mac and 4 Grade View: Grade III Tube type: Oral Tube size: 8.0 mm Number of attempts: 1 Airway Equipment and Method: Stylet Placement Confirmation: ETT inserted through vocal cords under direct vision,  positive ETCO2 and breath sounds checked- equal and bilateral Secured at: 22 cm Tube secured with: Tape Dental Injury: Teeth and Oropharynx as per pre-operative assessment

## 2014-11-17 NOTE — Transfer of Care (Signed)
Immediate Anesthesia Transfer of Care Note  Patient: Danny [name]  Procedure(s) Performed: Procedure(s) with comments: L1 Kyphoplasty (N/A) - L1 Kyphoplasty  Patient Location: PACU  Anesthesia Type:General  Level of Consciousness: awake, oriented and patient cooperative  Airway & Oxygen Therapy: Patient Spontanous Breathing and Patient connected to nasal cannula oxygen  Post-op Assessment: Report given to RN, Post -op Vital signs reviewed and stable and Patient moving all extremities  Post vital signs: Reviewed and stable  Last Vitals:  Filed Vitals:   11/16/14 1701  BP: 137/64  Pulse: 58  Temp: 36.8 C  Resp: 16    Complications: No apparent anesthesia complications

## 2014-11-19 ENCOUNTER — Encounter (HOSPITAL_COMMUNITY): Payer: Self-pay | Admitting: Neurosurgery

## 2014-11-20 NOTE — Discharge Summary (Signed)
Physician Discharge Summary  Patient ID: Danny [name] MRN: [id number] DOB/AGE: 71-24-1947 71 y.o.  Admit date: 11/16/2014 Discharge date: 11/20/2014  Admission Diagnoses:L 1 compression fracture with intractable back pain  Discharge Diagnoses: Same Active Problems:   Lumbar compression fracture   Discharged Condition: good  Hospital Course: Patient underwent uncomplicated L1 kyphoplasty for compression fracture  Consults: None  Significant Diagnostic Studies: None  Treatments: surgery: L1 kyphoplasty for compression fracture  Discharge Exam: Blood pressure 137/64, pulse 58, temperature 98.2 F (36.8 C), temperature source Oral, resp. rate 16, height 6\' 4"  (1.93 m), weight 82.101 kg (181 lb), SpO2 100 %. Neurologic: Alert and oriented X 3, normal strength and tone. Normal symmetric reflexes. Normal coordination and gait Wound:CDI  Disposition: Home     Medication List    ASK your doctor about these medications        aspirin EC 325 MG tablet  Take 325 mg by mouth daily.     diazepam 10 MG tablet  Commonly known as:  VALIUM  Take 10 mg by mouth every 12 (twelve) hours as needed.     DULoxetine 30 MG capsule  Commonly known as:  CYMBALTA  Take 30 mg by mouth 2 (two) times daily.     fentaNYL 75 MCG/HR  Commonly known as:  DURAGESIC - dosed mcg/hr  Place 75 mcg onto the skin every 3 (three) days.     gabapentin 800 MG tablet  Commonly known as:  NEURONTIN  Take 800 mg by mouth 2 (two) times daily.     HYDROcodone-acetaminophen 10-325 MG per tablet  Commonly known as:  NORCO  Take 1 tablet by mouth every 6 (six) hours as needed.     ibuprofen 800 MG tablet  Commonly known as:  ADVIL,MOTRIN  Take 800 mg by mouth every 8 (eight) hours as needed for fever, headache or moderate pain.     nitroGLYCERIN 0.4 MG SL tablet  Commonly known as:  NITROSTAT  Place 0.4 mg under the tongue every 5 (five) minutes x 3 doses as needed for chest pain.     promethazine 25  MG tablet  Commonly known as:  PHENERGAN  Take 25-50 mg by mouth 2 (two) times daily as needed for nausea or vomiting. For pain.     QUEtiapine 50 MG tablet  Commonly known as:  SEROQUEL  Take 50 mg by mouth at bedtime.         Signed: Peggyann Shoals, MD 11/20/2014, 8:47 AM

## 2016-02-18 ENCOUNTER — Ambulatory Visit (INDEPENDENT_AMBULATORY_CARE_PROVIDER_SITE_OTHER): Payer: Medicare Other | Admitting: Gastroenterology

## 2016-02-18 ENCOUNTER — Encounter: Payer: Self-pay | Admitting: Gastroenterology

## 2016-02-18 VITALS — BP 112/60 | HR 72 | Ht 74.75 in | Wt 174.6 lb

## 2016-02-18 DIAGNOSIS — R1084 Generalized abdominal pain: Secondary | ICD-10-CM

## 2016-02-18 DIAGNOSIS — K5903 Drug induced constipation: Secondary | ICD-10-CM

## 2016-02-18 MED ORDER — NALOXEGOL OXALATE 12.5 MG PO TABS: 12.5000 mg | ORAL_TABLET | ORAL | 2 refills | Status: AC

## 2016-02-18 NOTE — Progress Notes (Signed)
[facility] Progress Note  Chief Complaint: constipation and abdominal pain  Subjective  History:  Deatra Ina in Nov 2014:"History of Present Illness: 70 year old white male with history of coronary artery disease, neuropathy, depression, alcohol abuse, chronic pain syndrome, and colon polyps here for evaluation of abdominal pain.  In 2012 a large polyp in the ileocecal valve was removed.  Pathology demonstrated a sessile serrated adenoma.  Although followup colonoscopy within one year was recommended the patient has not had it done.  He complains of episodes of constipation.  With constipation he's had periumbilical pain.  Pain is relieved when he moves his bowels.  He denies rectal bleeding or melena.  He suffers from chronic back pain and uses a Duragesic patch." Colonoscopy Nov 2014 poor prep Diverticulitis requiring hospitalization June 2016 He believed we recalled him for a colonoscopy, but was confused and had brought his paperwork from Nov 2014. PCP gave Movantik samples - one tablet gave large result.  Then unclear how many tablets they gave him and how many he took.  Wife cannot provide much additional history. Uncertain if weight loss recently, poor appetite, eats very little due to chronic musculoskeletal pain.  ROS: Cardiovascular:  no chest pain Respiratory: no dyspnea Has diffuse chronic musculoskeletal pain  The patient's Past Medical, Family and Social History were reviewed and are on file in the EMR.  Objective:  Med list reviewed  Vital signs in last 24 hrs: Vitals:   02/18/16 1112  BP: 112/60  Pulse: 72    Physical Exam  Unkempt, , slow thought process, tangential , speech fluent  HEENT: sclera anicteric, oral mucosa moist without lesions  Neck: supple, no thyromegaly, JVD or lymphadenopathy  Cardiac: RRR without murmurs, S1S2 heard, no peripheral edema  Pulm: clear to auscultation bilaterally, normal RR and effort noted  Abdomen: soft, no tenderness, with  active bowel sounds. No guarding or palpable hepatosplenomegaly.  Skin; warm and dry, no jaundice or rash   @ASSESSMENTPLANBEGIN @ Assessment: Encounter Diagnoses  Name Primary?  . Generalized abdominal pain Yes  . Drug-induced constipation    He has long-standing OIC. He is up to date with colon polyp surveillance, last done Nov 2014.    Plan: Movantik 12.5 mg twice a week.   Total time 30 minutes, over half spent in counseling and coordination of care.   Kemmerer, DO

## 2016-02-18 NOTE — Patient Instructions (Addendum)
You have been given a separate informational sheet regarding your tobacco use, the importance of quitting and local resources to help you quit.  If you are age 72 or older, your body mass index should be between 23-30. Your Body mass index is 21.97 kg/m. If this is out of the aforementioned range listed, please consider follow up with your Primary Care Provider.  If you are age 50 or younger, your body mass index should be between 19-25. Your Body mass index is 21.97 kg/m. If this is out of the aformentioned range listed, please consider follow up with your Primary Care Provider.   Thank you for choosing [facility]  Dr Wilfrid Lund III

## 2016-07-23 IMAGING — CT CT ABD-PELV W/ CM
2 of 5 series · 3 of 46 positions shown, 4 images · IV contrast (Iodine)
Comparison: Prior CT abdomen/ pelvis 03/02/2013

CLINICAL DATA: 69-year-old male with generalized abdominal pain.

EXAM:
CT ABDOMEN AND PELVIS WITH CONTRAST
TECHNIQUE: Multidetector CT imaging of the abdomen and pelvis was performed
using the standard protocol following bolus administration of
intravenous contrast.
CONTRAST:  100mL OMNIPAQUE IOHEXOL 300 MG/ML  SOLN

[Series 206: coronals · coronal · 0.45mm/px · 2 of 135 slices shown, 3 images]
[im 45/135  soft-tissue]
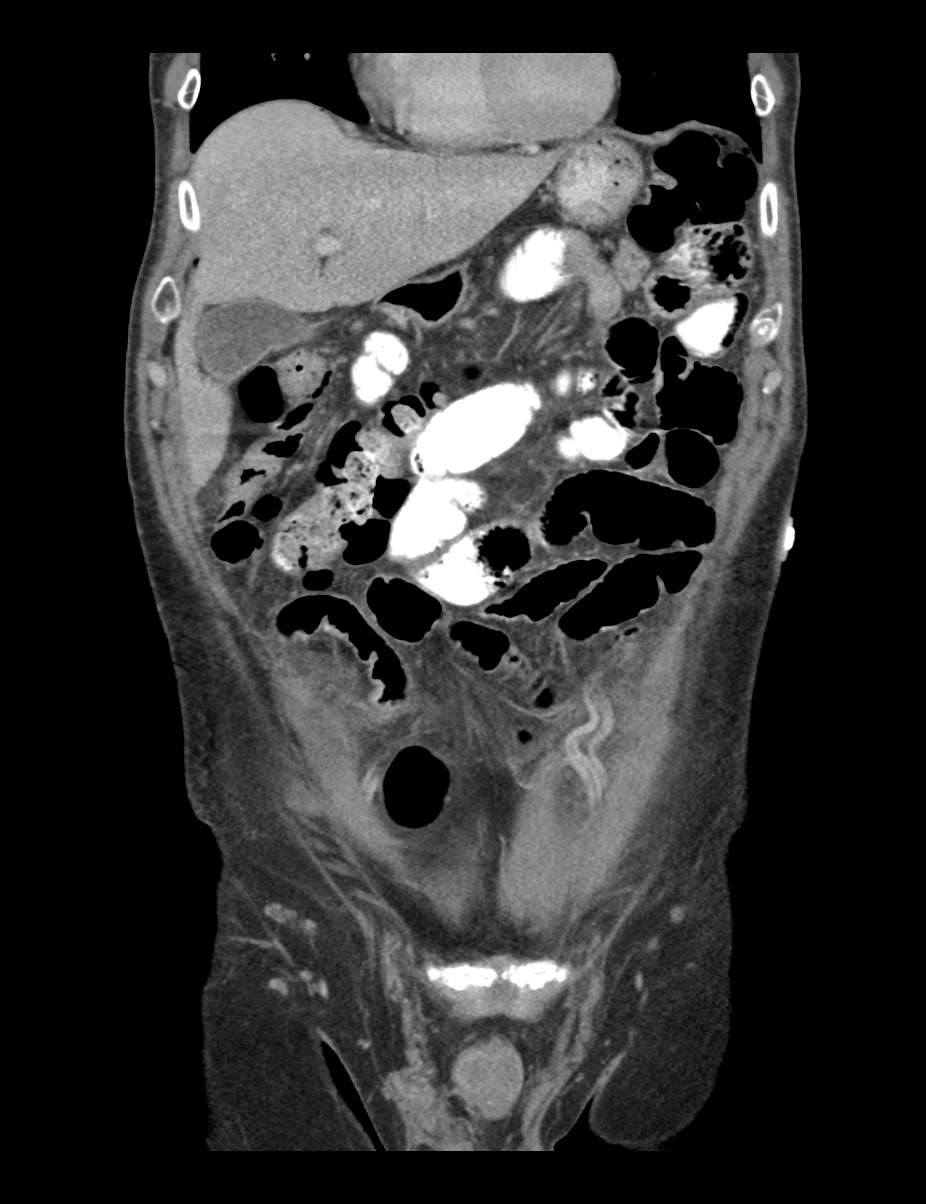
[im 45/135  bone]
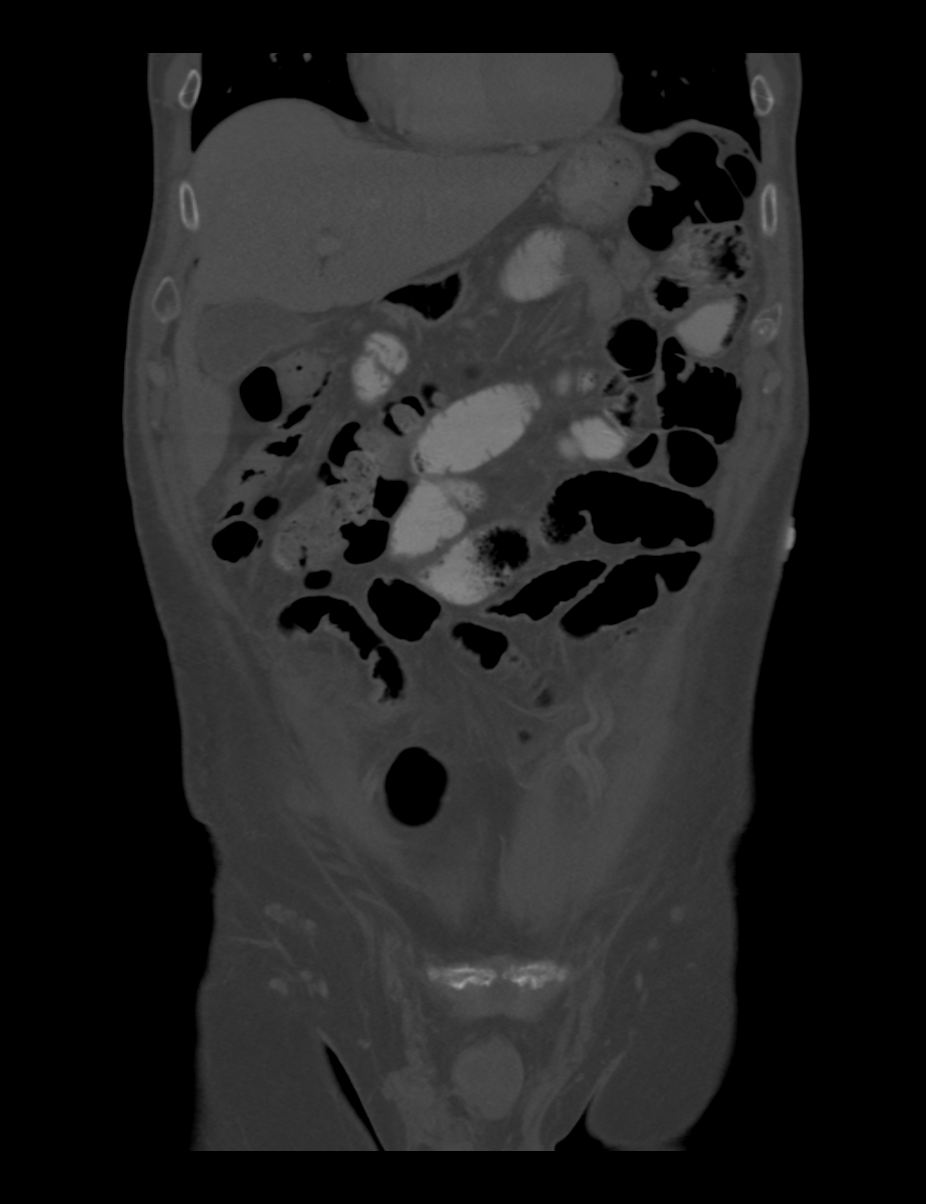
[im 105/135  soft-tissue]
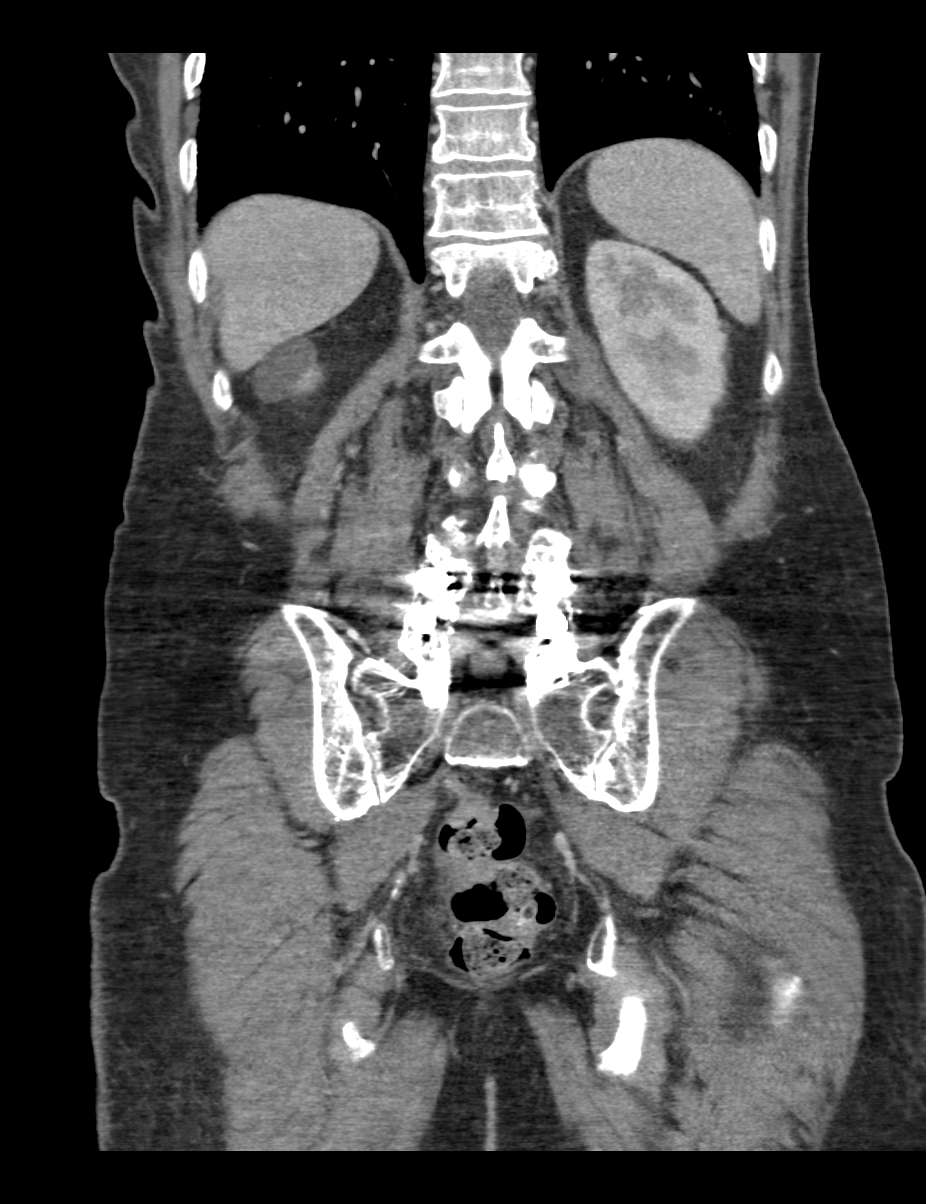

[Series 207: sag · sagittal · 0.45mm/px · 1 of 182 slices shown]
[im 61/182  soft-tissue]
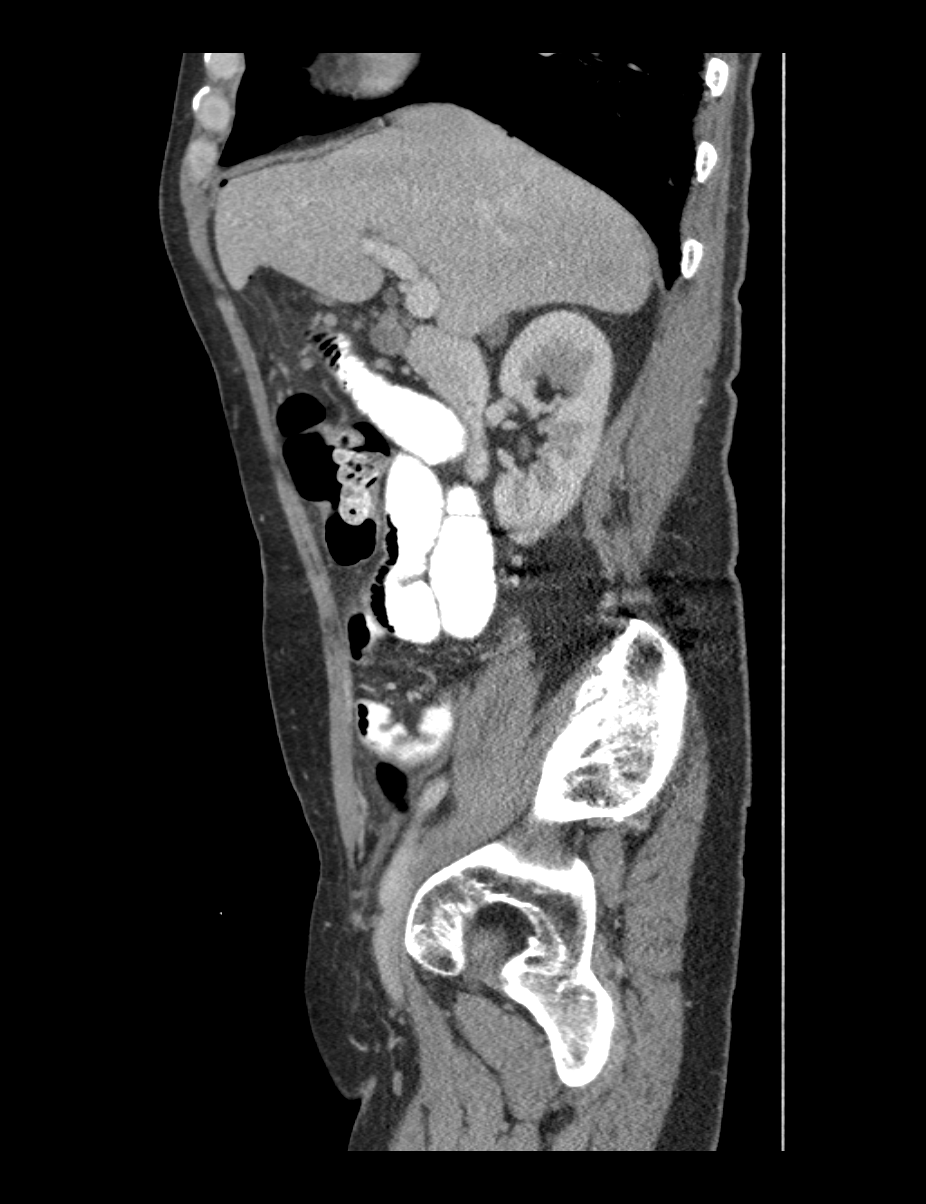

[3 of 46 positions shown; findings below may reference images not displayed]

FINDINGS: Lower Chest: The lung bases are clear. Visualized cardiac structures
are within normal limits for size. No pericardial effusion.
Unremarkable visualized distal thoracic esophagus.

Abdomen: Scattered locules of free air are present throughout the
upper abdomen in the perihepatic spaces, adjacent to the stomach, in
the left subdiaphragmatic space and along the course of the omentum.

The stomach and duodenum appear intact without definitive wall
thickening or evidence of perforation. There are 2 segments of
sigmoid colon which are abnormally thickened including 1 in the deep
anatomic pelvis position between the bladder and rectum, and a
second more proximal segment in the left lower quadrant at the
junction of the descending colon and sigmoid colon. Both regions of
focal wall thickening are affiliated with inflammatory stranding in
the adjacent pericolonic fat. No definite free air about either of
these structures. The degree of inflammatory changes more prominent
in the left lower quadrant at the junction of the descending and
sigmoid colons. There may be trace free fluid within the left
pericolic gutter. There is secondary reactive thickening of a loop
of small bowel in the left lower quadrant adjacent to the inflamed
segment of colon.

Unremarkable CT appearance of the spleen. The pancreas is largely
fatty replaced. Low-attenuation nodularity both adrenal glands (more
prominent on the right than the left) suggests adrenal hyperplasia
or multiple small adenomas. The appearance is unchanged dating back
to Monday February, 2013. Normal hepatic contour morphology. No discrete
hepatic lesion. High attenuation material layers within the
gallbladder lumen likely representing sludge and/or small stones. No
intra or extrahepatic biliary ductal dilatation.

Stable cyst exophytic from the posterior aspect of the upper pole of
the right kidney. Slight interval progression of nephrolithiasis in
the lower pole of the right kidney. The largest lower pole stone and
now measures up to 5 mm in diameter. There are at least 4 stones in
the right lower pole. No definite stones on the left. No evidence of
hydronephrosis. No enhancing renal mass.

Pelvis: Unremarkable bladder, prostate gland and seminal vesicles.
No free fluid or adenopathy.

Bones/Soft Tissues: No acute fracture or aggressive appearing lytic
or blastic osseous lesion. Extensive multilevel degenerative disc
disease. Surgical changes of prior L4-S1 posterior lumbar interbody
fusion with additional lateral fusion and interbody graft at L3-L4.
Compression fractures at T12 and L1 with changes of prior
augmentation at T12. Comparing across modalities to the recent
lumbar spine MRI dated 09/25/2014 there has been no significant
progression of height loss of the L1 compression fracture.

Vascular: Ectatic infrarenal abdominal aorta with a maximal diameter
of 2.9 cm. Scattered atherosclerotic vascular calcifications. No
focal venous abnormality. Main portal and splanchnic veins are
patent.
IMPRESSION: 1. CT imaging findings are most consistent with acute diverticulitis
in the left lower quadrant at the junction of the descending and
sigmoid colons. Additionally, there are multiple small locules of
free air in the upper abdomen concerning for microperforation. No
abscess collection. There is a trace volume of free fluid in the
left paracolic gutter and secondary reactive inflammation of an
adjacent loop of small bowel in the left lower quadrant.
2. There is a second segment of abnormally thickened and inflamed
sigmoid colon in the deep anatomic pelvis positioned between the
bladder and rectum. This may represent a second site of
diverticulitis, or a separate segmental infectious/inflammatory
colitis. Alternately, a colonic neoplasm is difficult to exclude
entirely at this location. However, the electronic medical record
reveals a negative colonoscopy within the last 3 years.
3. High attenuation material consistent was sludge and/or small
stones layers within the gallbladder lumen.
4. Slight progression of right lower pole nephrolithiasis. No
evidence of hydronephrosis.
5. Multilevel degenerative disc disease status post posterior lumbar
interbody fusion.
6. Stable L1 compression fracture without evidence of progressive
height loss compared MRI 09/25/2014.
[DATE]. Ectatic infrarenal abdominal aorta with a maximal diameter of
cm. Ectatic abdominal aorta at risk for aneurysm development.
Recommend followup by ultrasound in 5 years. This recommendation
follows ACR consensus guidelines: White Paper of the ACR Incidental

## 2018-04-13 DEATH — deceased

## 2018-06-04 ENCOUNTER — Encounter: Payer: Self-pay | Admitting: Gastroenterology
